# Patient Record
Sex: Female | Born: 1990 | Race: White | Hispanic: No | Marital: Single | Smoking: Former smoker
Health system: Southern US, Community
[De-identification: ages and names within clinical notes are randomized; demographics above are authoritative.]

## PROBLEM LIST (undated history)

## (undated) ENCOUNTER — Inpatient Hospital Stay (HOSPITAL_COMMUNITY): Payer: Self-pay

## (undated) DIAGNOSIS — O4290 Premature rupture of membranes, unspecified as to length of time between rupture and onset of labor, unspecified weeks of gestation: Secondary | ICD-10-CM

## (undated) DIAGNOSIS — A609 Anogenital herpesviral infection, unspecified: Secondary | ICD-10-CM

## (undated) DIAGNOSIS — Z8619 Personal history of other infectious and parasitic diseases: Secondary | ICD-10-CM

## (undated) HISTORY — DX: Personal history of other infectious and parasitic diseases: Z86.19

## (undated) HISTORY — DX: Anogenital herpesviral infection, unspecified: A60.9

## (undated) HISTORY — PX: NO PAST SURGERIES: SHX2092

---
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
## 2010-11-22 LAB — WOUND CULTURE

## 2011-06-15 ENCOUNTER — Emergency Department (HOSPITAL_COMMUNITY)
Admission: EM | Admit: 2011-06-15 | Discharge: 2011-06-15 | Disposition: A | Discharge status: Home / Self Care | Payer: Self-pay | Attending: Emergency Medicine | Admitting: Emergency Medicine

## 2011-06-15 ENCOUNTER — Encounter: Payer: Self-pay | Admitting: Adult Health

## 2011-06-15 DIAGNOSIS — H53149 Visual discomfort, unspecified: Secondary | ICD-10-CM | POA: Insufficient documentation

## 2011-06-15 DIAGNOSIS — G43909 Migraine, unspecified, not intractable, without status migrainosus: Secondary | ICD-10-CM | POA: Insufficient documentation

## 2011-06-15 DIAGNOSIS — R42 Dizziness and giddiness: Secondary | ICD-10-CM | POA: Insufficient documentation

## 2011-06-15 MED ORDER — DEXAMETHASONE SODIUM PHOSPHATE 10 MG/ML IJ SOLN
10.0000 mg | Freq: Once | INTRAMUSCULAR | Status: AC
  Administered 2011-06-15: 10 mg via INTRAVENOUS
  Filled 2011-06-15: qty 1

## 2011-06-15 MED ORDER — METOCLOPRAMIDE HCL 5 MG/ML IJ SOLN
10.0000 mg | Freq: Once | INTRAMUSCULAR | Status: AC
  Administered 2011-06-15: 10 mg via INTRAVENOUS
  Filled 2011-06-15: qty 2

## 2011-06-15 MED ORDER — SODIUM CHLORIDE 0.9 % IV BOLUS (SEPSIS)
1000.0000 mL | Freq: Once | INTRAVENOUS | Status: AC
  Administered 2011-06-15: 1000 mL via INTRAVENOUS

## 2011-06-15 MED ORDER — DIPHENHYDRAMINE HCL 50 MG/ML IJ SOLN
25.0000 mg | Freq: Once | INTRAMUSCULAR | Status: AC
  Administered 2011-06-15: 25 mg via INTRAVENOUS
  Filled 2011-06-15: qty 1

## 2011-06-15 NOTE — ED Notes (Signed)
Migraine that started 4 days ago associated with light and sound sensitivity and nausea. Taking IBuprofen without any relief

## 2011-06-15 NOTE — ED Provider Notes (Signed)
History     CSN: [id number] Arrival date & time: 06/15/2011  3:59 PM   First MD Initiated Contact with Patient 06/15/11 1759      Chief Complaint  Patient presents with  . Migraine    (Consider location/radiation/quality/duration/timing/severity/associated sxs/prior treatment) Patient is a 20 y.o. female presenting with migraine. The history is provided by the patient.  Migraine This is a chronic problem. The current episode started in the past 7 days. The problem occurs constantly. The problem has been unchanged. Associated symptoms include headaches. Pertinent negatives include no chest pain, chills, congestion, coughing, fatigue, fever, myalgias, neck pain, numbness, rash, sore throat or visual change. Associated symptoms comments: Positive nausea, photophobia. The symptoms are aggravated by bending. She has tried NSAIDs for the symptoms. The treatment provided mild relief.  Pain throbbing. Typical migraine for pt.  Past Medical History  Diagnosis Date  . Migraine     History reviewed. No pertinent past surgical history.  History reviewed. No pertinent family history.  History  Substance Use Topics  . Smoking status: Current Everyday Smoker    Types: Cigarettes  . Smokeless tobacco: Not on file  . Alcohol Use: No     Review of Systems  Constitutional: Negative for fever, chills and fatigue.  HENT: Negative for ear pain, congestion, sore throat, neck pain, neck stiffness and tinnitus.   Eyes: Positive for photophobia. Negative for pain and visual disturbance.  Respiratory: Negative for cough and shortness of breath.   Cardiovascular: Negative for chest pain and leg swelling.  Musculoskeletal: Negative for myalgias.  Skin: Negative for rash.  Neurological: Positive for dizziness and headaches. Negative for numbness.  All other systems reviewed and are negative.    Allergies  Review of patient's allergies indicates no known allergies.  Home Medications   Current  Outpatient Rx  Name Route Sig Dispense Refill  . IBUPROFEN 200 MG PO TABS Oral Take 200 mg by mouth every 6 (six) hours as needed. Pain.      BP 108/66  Pulse 57  Temp(Src) 97.8 F (36.6 C) (Oral)  Resp 20  SpO2 100%  Physical Exam  Constitutional: She is oriented to person, place, and time. She appears well-developed and well-nourished. She appears distressed.       Sitting in dark room with sunglasses on  HENT:  Head: Normocephalic and atraumatic.  Eyes: Conjunctivae and EOM are normal. Pupils are equal, round, and reactive to light.  Neck: Normal range of motion. Neck supple.  Cardiovascular: Normal rate and regular rhythm.  Exam reveals no gallop and no friction rub.   No murmur heard. Pulmonary/Chest: Effort normal and breath sounds normal.  Abdominal: Soft. She exhibits no distension. There is no tenderness.  Musculoskeletal: She exhibits no edema and no tenderness.  Neurological: She is alert and oriented to person, place, and time. She has normal strength. No cranial nerve deficit or sensory deficit. Coordination and gait normal.  Skin: Skin is warm and dry. No rash noted.  Psychiatric: She has a normal mood and affect. Her behavior is normal.    ED Course  Procedures (including critical care time)    1. Migraine       MDM  Typical migraine for pt. Pt pain significantly improved after fluids and medications- no analgesics necessary. Will d/c home.        Elwyn Reach [place], [place] 06/15/11 2011

## 2011-06-15 NOTE — ED Provider Notes (Signed)
Medical screening examination/treatment/procedure(s) were conducted as a shared visit with non-physician practitioner(s) and myself.  I personally evaluated the patient during the encounter   Nelia Shi, MD 06/15/11 2232

## 2011-08-23 ENCOUNTER — Inpatient Hospital Stay (HOSPITAL_COMMUNITY)
Admission: RE | Admit: 2011-08-23 | Discharge: 2011-08-30 | DRG: 897 | Disposition: A | Discharge status: Home / Self Care | Payer: No Typology Code available for payment source | Attending: Psychiatry | Admitting: Psychiatry

## 2011-08-23 ENCOUNTER — Encounter (HOSPITAL_COMMUNITY): Payer: Self-pay | Admitting: *Deleted

## 2011-08-23 ENCOUNTER — Emergency Department (HOSPITAL_COMMUNITY)
Admission: EM | Admit: 2011-08-23 | Discharge: 2011-08-23 | Disposition: A | Discharge status: Other Acute Inpatient Hospital | Payer: Self-pay | Attending: Emergency Medicine | Admitting: Emergency Medicine

## 2011-08-23 DIAGNOSIS — A749 Chlamydial infection, unspecified: Secondary | ICD-10-CM

## 2011-08-23 DIAGNOSIS — F1994 Other psychoactive substance use, unspecified with psychoactive substance-induced mood disorder: Secondary | ICD-10-CM

## 2011-08-23 DIAGNOSIS — F1123 Opioid dependence with withdrawal: Secondary | ICD-10-CM | POA: Diagnosis present

## 2011-08-23 DIAGNOSIS — F1220 Cannabis dependence, uncomplicated: Secondary | ICD-10-CM

## 2011-08-23 DIAGNOSIS — F1910 Other psychoactive substance abuse, uncomplicated: Secondary | ICD-10-CM | POA: Insufficient documentation

## 2011-08-23 DIAGNOSIS — F1120 Opioid dependence, uncomplicated: Principal | ICD-10-CM

## 2011-08-23 DIAGNOSIS — F1310 Sedative, hypnotic or anxiolytic abuse, uncomplicated: Secondary | ICD-10-CM

## 2011-08-23 DIAGNOSIS — F1193 Opioid use, unspecified with withdrawal: Secondary | ICD-10-CM | POA: Diagnosis present

## 2011-08-23 LAB — COMPREHENSIVE METABOLIC PANEL
ALT: 8 U/L (ref 0–35)
Albumin: 4.3 g/dL (ref 3.5–5.2)
Alkaline Phosphatase: 48 U/L (ref 39–117)
Glucose, Bld: 93 mg/dL (ref 70–99)
Potassium: 3.8 mEq/L (ref 3.5–5.1)
Sodium: 139 mEq/L (ref 135–145)
Total Protein: 7.8 g/dL (ref 6.0–8.3)

## 2011-08-23 LAB — POCT PREGNANCY, URINE: Preg Test, Ur: NEGATIVE

## 2011-08-23 LAB — CBC
Hemoglobin: 14.3 g/dL (ref 12.0–15.0)
MCHC: 34.8 g/dL (ref 30.0–36.0)
RDW: 11.7 % (ref 11.5–15.5)
WBC: 6.1 10*3/uL (ref 4.0–10.5)

## 2011-08-23 LAB — RAPID URINE DRUG SCREEN, HOSP PERFORMED
Amphetamines: NOT DETECTED
Barbiturates: NOT DETECTED
Benzodiazepines: NOT DETECTED

## 2011-08-23 MED ORDER — TRAZODONE HCL 50 MG PO TABS
50.0000 mg | ORAL_TABLET | Freq: Every evening | ORAL | Status: DC | PRN
  Administered 2011-08-23 – 2011-08-29 (×10): 50 mg via ORAL
  Filled 2011-08-23 (×17): qty 1

## 2011-08-23 MED ORDER — DICYCLOMINE HCL 20 MG PO TABS: 20.0000 mg | ORAL_TABLET | ORAL | Status: AC | PRN

## 2011-08-23 MED ORDER — NICOTINE 21 MG/24HR TD PT24
21.0000 mg | MEDICATED_PATCH | Freq: Every day | TRANSDERMAL | Status: DC
  Administered 2011-08-23 – 2011-08-29 (×7): 21 mg via TRANSDERMAL
  Filled 2011-08-23 (×8): qty 1

## 2011-08-23 MED ORDER — LOPERAMIDE HCL 2 MG PO CAPS: 2.0000 mg | ORAL_CAPSULE | ORAL | Status: AC | PRN

## 2011-08-23 MED ORDER — IBUPROFEN 200 MG PO TABS: 400.0000 mg | ORAL_TABLET | Freq: Three times a day (TID) | ORAL | Status: DC | PRN

## 2011-08-23 MED ORDER — ONDANSETRON 4 MG PO TBDP
4.0000 mg | ORAL_TABLET | Freq: Four times a day (QID) | ORAL | Status: AC | PRN
Start: 2011-08-23 — End: 2011-08-28
  Administered 2011-08-24 – 2011-08-26 (×2): 4 mg via ORAL
  Filled 2011-08-23: qty 1

## 2011-08-23 MED ORDER — CLONIDINE HCL 0.1 MG PO TABS
0.1000 mg | ORAL_TABLET | Freq: Four times a day (QID) | ORAL | Status: AC
  Administered 2011-08-23 – 2011-08-25 (×5): 0.1 mg via ORAL
  Filled 2011-08-23 (×10): qty 1

## 2011-08-23 MED ORDER — ACETAMINOPHEN 325 MG PO TABS
650.0000 mg | ORAL_TABLET | Freq: Four times a day (QID) | ORAL | Status: DC | PRN
  Administered 2011-08-25 – 2011-08-28 (×3): 650 mg via ORAL

## 2011-08-23 MED ORDER — IBUPROFEN 200 MG PO TABS: 600.0000 mg | ORAL_TABLET | Freq: Three times a day (TID) | ORAL | Status: DC | PRN

## 2011-08-23 MED ORDER — METHOCARBAMOL 500 MG PO TABS: 500.0000 mg | ORAL_TABLET | Freq: Three times a day (TID) | ORAL | Status: AC | PRN

## 2011-08-23 MED ORDER — ALUM & MAG HYDROXIDE-SIMETH 200-200-20 MG/5ML PO SUSP: 30.0000 mL | ORAL | Status: DC | PRN

## 2011-08-23 MED ORDER — LORAZEPAM 1 MG PO TABS: 1.0000 mg | ORAL_TABLET | Freq: Three times a day (TID) | ORAL | Status: DC | PRN

## 2011-08-23 MED ORDER — CLONIDINE HCL 0.1 MG PO TABS
0.1000 mg | ORAL_TABLET | ORAL | Status: AC
  Administered 2011-08-26 – 2011-08-27 (×2): 0.1 mg via ORAL
  Filled 2011-08-23 (×4): qty 1

## 2011-08-23 MED ORDER — HYDROXYZINE HCL 25 MG PO TABS
25.0000 mg | ORAL_TABLET | Freq: Four times a day (QID) | ORAL | Status: AC | PRN
  Administered 2011-08-23 – 2011-08-27 (×5): 25 mg via ORAL
  Filled 2011-08-23: qty 1

## 2011-08-23 MED ORDER — NAPROXEN 500 MG PO TABS: 500.0000 mg | ORAL_TABLET | Freq: Two times a day (BID) | ORAL | Status: AC | PRN

## 2011-08-23 MED ORDER — ZOLPIDEM TARTRATE 5 MG PO TABS: 5.0000 mg | ORAL_TABLET | Freq: Every evening | ORAL | Status: DC | PRN

## 2011-08-23 MED ORDER — ONDANSETRON HCL 4 MG PO TABS: 4.0000 mg | ORAL_TABLET | Freq: Three times a day (TID) | ORAL | Status: DC | PRN

## 2011-08-23 MED ORDER — CLONIDINE HCL 0.1 MG PO TABS
0.1000 mg | ORAL_TABLET | Freq: Every day | ORAL | Status: DC
  Administered 2011-08-30: 0.1 mg via ORAL
  Filled 2011-08-23 (×2): qty 1

## 2011-08-23 MED ORDER — MAGNESIUM HYDROXIDE 400 MG/5ML PO SUSP: 30.0000 mL | Freq: Every day | ORAL | Status: DC | PRN

## 2011-08-23 NOTE — ED Notes (Signed)
Pt now decided to stay b/c she called her mother to pick her up and her mother said she can stay or be on the street.  Pt is agrees to stay.   Eric from [facility] has called back.  He says she will go to bed 307-2 and that when pt gets there and decides she wants to leave, she will be under IVC.  Pt will be explained this information.

## 2011-08-23 NOTE — ED Provider Notes (Signed)
Medical screening examination/treatment/procedure(s) were performed by non-physician practitioner and as supervising physician I was immediately available for consultation/collaboration.  Natasha Paulson R Francis Doenges, MD 08/23/11 1956 

## 2011-08-23 NOTE — ED Notes (Signed)
Pt. Had blood drawn. Pt. Belongings are placed in personal bag. Pt. Belongings are black pants, shirt, white/gray jacket, pink and white tennis shoes, black samsung cell phone, socks

## 2011-08-23 NOTE — BH Assessment (Addendum)
Pt assessed and [facility] at sent to [facility] pending medical clearance.   Later received call from [facility] nurse at [facility]. Sts that patient is now medically cleared. Writer contacted assessment department-spoke to Shay and made her aware that pt was cleared.   Writer contacted EDP (Dr. Preston Fleeting) and made him aware of patients disposition. He was informed that pt was here pending clearance and accepted at [facility]. Pt placed up for discharge. Writer initiated support paperwork (obtained MR# with Shelly Coss, gathered voluntary and consent for patient to sign, etc.) Entered the room and explained to patient that she would be transported after signing support papers. Pt sts, "I changed my mind..I don't want to go anymore ..my parents are here to pick me up". Writer consulted with nurse and PA-Sherri. We both decided that pt would be discharged home with referral information.   Pt given referral information for follow up. Pt has referrals to CD-IOP programs, support groups, various out-pt providers, etc.   Susa Raring in the assessment office and made her aware that patient changed her mind and out-pt referrals were given.

## 2011-08-23 NOTE — ED Notes (Addendum)
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 Last used yesterday. Pt reports n/v, diaphoresis, chills, shaking, HA. Has been to [facility] and has been accepted with a bed pending per Consulting civil engineer. Process of medical clearance explained, pt verbalizes understanding and agreement. Denies SI/HI.

## 2011-08-23 NOTE — ED Notes (Signed)
Report to [facility] at [facility].   She states pt is ok to head over now.

## 2011-08-23 NOTE — Progress Notes (Signed)
Patient ID: Deborah Russell, female   DOB: 03-27-1991, 22 y.o.   MRN: [id number] Pt admitted voluntarily to [facility] for detox. Pt presented w/mother. Pt acknowledges using marijuana since the age of 42 and heroine for 2 years.  Pt states that for a while she was also using pain pills but denies current use. Pt states that she is here because her family found out what she was doing and she didn't want to disappoint them. Pt asked if she would have made the decision to detox if her family hadn't found out and she replied she was on the verge of making that decision because she has nephews and she didn't want to miss them growing up. Pt denies SI, HI and AVH. Initial COW assessment 9. Pt pleasant and cooperative during admission process. Fifteen minute checks initiated. Pt oriented to unit.

## 2011-08-23 NOTE — BH Assessment (Signed)
Assessment Note   Deborah Russell is an 21 y.o. female. PT PRESENTS WITH DEPRESSION STATING THAT FAMILY FOUND OUT THAT SHE HAD BEEN ABUSING HEROINE; 5 BAGS PER DAY. PT DENIES CURRENT IDEATION OR ANY HX OF TX. PT SAYS  HER APPETITE HAS DECREASE AS WELL AS SLEEP IN  DAYS. PT IS ABLE TO CONTRACT FOR SAFETY & WAS REFERRED TO [facility] FOR MEDICAL CLEARANCE.  Axis I: Substance Induced Mood Disorder and ALCOHOL DEPENDENCE Axis II: Deferred Axis III:  Past Medical History  Diagnosis Date  . Migraine    Axis IV: problems with primary support group Axis V: 31-40 impairment in reality testing  Past Medical History:  Past Medical History  Diagnosis Date  . Migraine     History reviewed. No pertinent past surgical history.  Family History: No family history on file.  Social History:  reports that she has been smoking Cigarettes.  She does not have any smokeless tobacco history on file. She reports that she drinks alcohol. She reports that she uses illicit drugs (Marijuana).  Additional Social History:    Allergies: No Known Allergies  Home Medications:  No current facility-administered medications on file as of 08/23/2011.   Medications Prior to Admission  Medication Sig Dispense Refill  . ibuprofen (ADVIL,MOTRIN) 200 MG tablet Take 200 mg by mouth every 6 (six) hours as needed. Pain.        OB/GYN Status:  Patient's last menstrual period was 08/04/2011.  General Assessment Data Location of Assessment: [facility] Assessment Services Living Arrangements: Relatives;Parent Can pt return to current living arrangement?: No Admission Status: Voluntary Is patient capable of signing voluntary admission?: Yes Transfer from: Home Referral Source: Self/Family/Friend     Risk to self Suicidal Ideation: No Suicidal Intent: No Is patient at risk for suicide?: No Suicidal Plan?: No Access to Means: No What has been your use of drugs/alcohol within the last 12 months?: HEROINE STARTED AT AGE 35; USES  5 BAGS($50) & LAST USE WAS 1/14//13 Previous Attempts/Gestures: No How many times?: 0  Other Self Harm Risks: NA Triggers for Past Attempts: Unpredictable Intentional Self Injurious Behavior: None Family Suicide History: No Recent stressful life event(s): Turmoil (Comment) Persecutory voices/beliefs?: No Depression: No Depression Symptoms: Loss of interest in usual pleasures;Guilt;Feeling worthless/self pity Substance abuse history and/or treatment for substance abuse?: No Suicide prevention information given to non-admitted patients: Not applicable  Risk to Others Homicidal Ideation: No Thoughts of Harm to Others: No Current Homicidal Intent: No Current Homicidal Plan: No Access to Homicidal Means: No Identified Victim: NA History of harm to others?: No Assessment of Violence: None Noted Violent Behavior Description: CALM, COOPERATIVE Does patient have access to weapons?: No Criminal Charges Pending?: No Does patient have a court date: No  Psychosis Hallucinations: None noted Delusions: None noted  Mental Status Report Appear/Hygiene: Improved Eye Contact: Good Motor Activity: Freedom of movement Speech: Logical/coherent;Soft Level of Consciousness: Alert Mood: Depressed;Sad Affect: Appropriate to circumstance;Sad Anxiety Level: None Thought Processes: Coherent;Relevant Judgement: Unimpaired Orientation: Person;Place;Time;Situation Obsessive Compulsive Thoughts/Behaviors: None  Cognitive Functioning Concentration: Decreased Memory: Recent Intact;Remote Intact IQ: Average Insight: Fair Impulse Control: Fair Appetite: Poor Weight Loss: 0  Weight Gain: 0  Sleep: Decreased Total Hours of Sleep: 0  Vegetative Symptoms: None  Prior Inpatient Therapy Prior Inpatient Therapy: No Prior Therapy Dates: NA Prior Therapy Facilty/Provider(s): NA Reason for Treatment: NA  Prior Outpatient Therapy Prior Outpatient Therapy: No Prior Therapy Dates: NA Prior Therapy  Facilty/Provider(s): NA Reason for Treatment: NA  Additional Information 1:1 In Past 12 Months?: No CIRT Risk: No Elopement Risk: No Does patient have medical clearance?: Yes     Disposition:  Disposition Disposition of Patient: Inpatient treatment program;Referred to ([facility]- ACCEPTED BY DR. Allena Katz) Type of inpatient treatment program: Adult  On Site Evaluation by:   Reviewed with Physician:     Waldron Session 08/23/2011 1:14 PM

## 2011-08-23 NOTE — ED Notes (Signed)
Been on hold x 9 minutes to give report. Unable to hold anymore. Will try and call back again.

## 2011-08-23 NOTE — ED Notes (Signed)
Attempting to give report to [facility].  Pt just walked to the bathroom w/her mother.  NAD noted.  Pt states understanding that she will be under IVC if she tries to leave again.

## 2011-08-23 NOTE — ED Notes (Signed)
Pt wanded and belongings searched by security. 

## 2011-08-23 NOTE — Tx Team (Signed)
Initial Interdisciplinary Treatment Plan  PATIENT STRENGTHS: (choose at least two) Ability for insight Average or above average intelligence Communication skills General fund of knowledge Motivation for treatment/growth Physical Health Supportive family/friends  PATIENT STRESSORS: Financial difficulties Occupational concerns Substance abuse   PROBLEM LIST: Problem List/Patient Goals Date to be addressed Date deferred Reason deferred Estimated date of resolution  detox 08/23/2011     anxiety 08/23/2011     depression 08/23/2011                                          DISCHARGE CRITERIA:  Ability to meet basic life and health needs Adequate post-discharge living arrangements Improved stabilization in mood, thinking, and/or behavior Motivation to continue treatment in a less acute level of care Need for constant or close observation no longer present Verbal commitment to aftercare and medication compliance Withdrawal symptoms are absent or subacute and managed without 24-hour nursing intervention  PRELIMINARY DISCHARGE PLAN: Outpatient therapy Participate in family therapy Return to previous living arrangement  PATIENT/FAMIILY INVOLVEMENT: This treatment plan has been presented to and reviewed with the patient, Deborah Russell, and/or family member,.  The patient and family have been given the opportunity to ask questions and make suggestions.  Deborah Russell 08/23/2011, 7:39 PM

## 2011-08-23 NOTE — ED Notes (Signed)
[facility] called.  Pt will be going to Rm 307 bed 2 but RN unable to take report as it is shift change.  Undersigned RN asked to call back in .

## 2011-08-23 NOTE — ED Notes (Signed)
Patient is resting comfortably.  NAD noted.  Has just used the telephone.

## 2011-08-23 NOTE — ED Provider Notes (Signed)
History     CSN: [id number]  Arrival date & time 08/23/11  1250   First MD Initiated Contact with Patient 08/23/11 1325      Chief Complaint  Patient presents with  . Medical Clearance    (Consider location/radiation/quality/duration/timing/severity/associated sxs/prior treatment) Patient is a 21 y.o. female presenting with drug/alcohol assessment. The history is provided by the patient.  Drug / Alcohol Assessment  Pt here asking for detox from drug use. Pt states she smokes pot and uses heroin. States uses it daily. Never been in detox in the past. Denies depression, anxiety, any other drugs or alcohol. No medical problems. Does not take any medications. Denies SI or HI.  Past Medical History  Diagnosis Date  . Migraine     History reviewed. No pertinent past surgical history.  No family history on file.  History  Substance Use Topics  . Smoking status: Current Everyday Smoker    Types: Cigarettes  . Smokeless tobacco: Not on file  . Alcohol Use: Yes     occasionally    OB History    Grav Para Term Preterm Abortions TAB SAB Ect Mult Living                  Review of Systems  Constitutional: Negative.   HENT: Negative.   Eyes: Negative.   Respiratory: Negative.   Cardiovascular: Negative.   Gastrointestinal: Negative.   Genitourinary: Negative.   Musculoskeletal: Negative.   Neurological: Negative.   Psychiatric/Behavioral: Negative for suicidal ideas and behavioral problems. The patient is not nervous/anxious.     Allergies  Review of patient's allergies indicates no known allergies.  Home Medications   Current Outpatient Rx  Name Route Sig Dispense Refill  . IBUPROFEN 200 MG PO TABS Oral Take 200 mg by mouth every 6 (six) hours as needed. Pain.      BP 111/69  Pulse 67  Temp(Src) 98.1 F (36.7 C) (Oral)  Resp 18  SpO2 100%  LMP 08/04/2011  Physical Exam  Nursing note and vitals reviewed. Constitutional: She is oriented to person, place,  and time. She appears well-developed and well-nourished. No distress.  HENT:  Head: Normocephalic and atraumatic.  Eyes: Conjunctivae are normal.  Neck: Neck supple.  Cardiovascular: Normal rate, regular rhythm and normal heart sounds.   No murmur heard. Pulmonary/Chest: Effort normal and breath sounds normal. No respiratory distress.  Abdominal: Soft. Bowel sounds are normal. There is no tenderness.  Musculoskeletal: Normal range of motion.  Neurological: She is alert and oriented to person, place, and time.  Skin: Skin is warm and dry.  Psychiatric: She has a normal mood and affect.    ED Course  Procedures (including critical care time)  1:50 PM Pt seen and examined by me. Pt requesting heroin detox. Pt in NAD. Denies SI or hI. Pleasant, cooperative. Spoke with [facility], will come and assess.  No diagnosis found.  4:06 PM Pt apparently got a bed, but she now wants to leave, does not want to stay. Wants to do outpatient detox. Will see if [facility] can come and provide outpatient resources. Pt denies SI or HI.   MDM          Lottie Mussel, PA 08/23/11 1600  Lottie Mussel, [place] 08/23/11 1607

## 2011-08-23 NOTE — ED Notes (Signed)
Friend visiting with pt. Visitor wanded by security before seeing pt.

## 2011-08-24 DIAGNOSIS — F1120 Opioid dependence, uncomplicated: Principal | ICD-10-CM

## 2011-08-24 NOTE — Progress Notes (Addendum)
Pt is out in milieu attending groups and interacting with peers.  C/O nausea and anxiety r/t opiate withdrawal. PRN medications requested and received. Instructed on course of detox.  Support and encouragement offered. 15' checks cont for safety. Cont current POC.  1500- Urine obtained for labs. Genice Rouge RN
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
## 2011-08-24 NOTE — [facility] Suicide Risk Assessment (Signed)
Suicide Risk Assessment  Admission Assessment     Demographic factors:  See chart.  Current Mental Status:  Patient seen and evaluated. Chart reviewed. Patient stated that her mood was "good". Her affect was mood congruent and euthymic. She denied any current thoughts of self injurious behavior, suicidal ideation or homicidal ideation. There were no auditory or visual hallucinations, paranoia, delusional thought processes, or mania noted.  Thought process was linear and goal directed.  No psychomotor agitation or retardation was noted. Speech was normal rate, tone and volume. Eye contact was good. Judgment and insight are limited.  Patient has been up and engaged on the unit.  No acute safety concerns reported from team.  Loss Factors: relationship with BF    Historical Factors: No hx reported SI/HI/Self injurious behavior/attempts/plans.  Risk Reduction Factors:  Risk Reduction Factors: Employed;Living with another person, especially a relative;Positive social support; lives with mother  CLINICAL FACTORS: Opioid Dependence and Withdrawal; Cannabis Dependence; r/o Benzo Abuse; r/o SIMD; Migraines  COGNITIVE FEATURES THAT CONTRIBUTE TO RISK: limited insight.  SUICIDE RISK: Patient is currently viewed as a low risk of harm to herself and others in light of her history and risk factors. There are no acute safety concerns and she is stable for admission. Her continued sobriety, potential medication management and followup will mitigate against any potential risk in the future.   VS: Filed Vitals:   08/24/11 0801  BP: 88/56  Pulse: 134  Temp:   Resp:     Repeat vitals at 1145am: 101/69; 64 - 94/63; 96; 97.5 Temp; 20 R  Meds:   . cloNIDine  0.1 mg Oral QID   Followed by  . cloNIDine  0.1 mg Oral BH-qamhs   Followed by  . cloNIDine  0.1 mg Oral QAC breakfast  . nicotine  21 mg Transdermal Daily  . traZODone  50 mg Oral QHS,MR X 1    PLAN OF CARE: Pt admitted for crisis  stabilization and treatment.  Please see orders.   Medications reviewed with pt and medication education provided.  Pt seen and evaluated by physician extender as well, please see H&P.  Will continue q15 minute checks per unit protocol.  No clinical indication for one on one level of observation at this time.  Pt contracting for safety.  Mental health treatment, medication management and continued sobriety will mitigate against the increased risk of harm to self and/or others.  Discussed the importance of recovery with pt, as well as, tools to move forward in a healthy & safe manner.  Pt agreeable with the plan.  Discussed with the team. Pt interested in rehab.  Will follow vitals and adjust meds as indicated.  Pt encouraged to push fluids and was given gatorade as well.
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
## 2011-08-24 NOTE — Progress Notes (Signed)
This newly admitted pt has been started on the clonidine protocol with her first dose given tonight. Pt presented with nicotine cravings upon arrival on the unit. Pt was given a nicotine patch and informed of removing at bedtime in order to prevent nightmares. Pt has been adjusting to the unit appropriately. Continued support and availability has been extended to this pt. Pt safety remains with this patient.

## 2011-08-24 NOTE — Progress Notes (Signed)
Pt has been in the dayroom interacting with other patients.  She is on Clonidine Protocol for opiate addiction.  She denies any withdrawal symptoms at this time.  She denies SI/HI.  She has labs pending for STDs.  She voices no needs/concerns at this time.  Safety maintained with q15 minute checks.
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
## 2011-08-25 LAB — GC/CHLAMYDIA PROBE AMP, URINE: GC Probe Amp, Urine: NEGATIVE

## 2011-08-25 LAB — RPR: RPR Ser Ql: NONREACTIVE

## 2011-08-25 MED ORDER — MENTHOL 3 MG MT LOZG
1.0000 | LOZENGE | OROMUCOSAL | Status: DC | PRN
  Administered 2011-08-25: 3 mg via ORAL

## 2011-08-25 NOTE — Progress Notes (Signed)
Patient ID: Deborah Russell, female   DOB: 1991-03-29, 21 y.o.   MRN: [id number] Patient has been attending groups and going to dining room for meals.   Has been cooperative and pleasant this afternoon.

## 2011-08-25 NOTE — Tx Team (Signed)
Interdisciplinary Treatment Plan Update (Adult)  Date:  08/25/2011  Time Reviewed:  9:58 AM   Progress in Treatment: Attending groups: Yes Participating in groups:  Yes Taking medication as prescribed: Yes Tolerating medication:  Yes Family/Significant othe contact made:  Counselor assessing for appropriate contact Patient understands diagnosis:  Yes Discussing patient identified problems/goals with staff:  Yes Medical problems stabilized or resolved:  Yes Denies suicidal/homicidal ideation: Yes Issues/concerns per patient self-inventory:  None identified Other: N/A  New problem(s) identified: None Identified  Reason for Continuation of Hospitalization: Medication stabilization Withdrawal symptoms  Interventions implemented related to continuation of hospitalization: mood stabilization, medication monitoring and adjustment, group therapy and psycho education, safety checks q 15 mins  Additional comments: N/A  Estimated length of stay: 5 days  Discharge Plan: Pt requesting long term treatment - SW assessing for appropriate referrals  New goal(s): N/A  Review of initial/current patient goals per problem list:    1.  Goal(s): Address substance use  Met:  No  Target date: by discharge  As evidenced by: completing detox protocol and refer to appropriate treatment  2.  Goal (s): Reduce depressive symptoms  Met:  No  Target date: by discharge  As evidenced by: Reducing depression from a 10 to a 3 as reported by pt.  Pt denies depression.   3.  Goal(s): Reduce anxiety symptoms  Met:  No  Target date: by discharge  As evidenced by: Reducing anxiety from a 10 to a 3 as reported by pt.  Pt denies anxiety.    Attendees: Patient:  Deborah Russell 08/25/2011 10:00 AM   Family:     Physician:  Lupe Carney, DO 08/25/2011 9:58 AM   Nursing: Quintella Reichert, RN 08/25/2011 9:58 AM   Case Manager:  Reyes Ivan, LCSWA 08/25/2011  9:58 AM   Counselor:  Ronda Fairly, LCSWA 08/25/2011  9:58 AM   Other:  Richelle Ito, LCSW 08/25/2011 9:58 AM   Other:  Carolynn Comment, RN 08/25/2011 10:05 AM   Other:  Verne Spurr, PA 08/25/2011 10:05 AM   Other:  Tanya Nones, SW intern 08/25/2011 10:05 AM    Scribe for Treatment Team:   Reyes Ivan 08/25/2011 9:58 AM

## 2011-08-25 NOTE — Progress Notes (Signed)
Pt attended discharge planning group and actively participated.  Pt presents with calm mood and affect.  Pt states she does not have depression, anxiety and SI.  Pt was open with sharing reason for entering the hospital.  Pt states that she has been using heroin for 2 years and her family recently found out and want her to get help.  Pt states she feels guilty for using because her niece and nephew look up to her.  Pt states she wants help as well, requesting long term treatment.  Pt states it was both her and her family's idea to come here.  Pt states she resides with her mom in [place] and has transportation.  SW will make a referral to [facility] per pt's request for treatment.  No further needs at this time.  Safety planning and suicide prevention discussed.     Reyes Ivan, LCSWA 08/25/2011  9:41 AM
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
## 2011-08-26 DIAGNOSIS — A749 Chlamydial infection, unspecified: Secondary | ICD-10-CM | POA: Diagnosis present

## 2011-08-26 LAB — HEPATITIS PANEL, ACUTE
HCV Ab: NEGATIVE
Hepatitis B Surface Ag: NEGATIVE

## 2011-08-26 MED ORDER — AZITHROMYCIN 500 MG PO TABS
1000.0000 mg | ORAL_TABLET | Freq: Once | ORAL | Status: AC
  Administered 2011-08-26: 1000 mg via ORAL
  Filled 2011-08-26: qty 4
  Filled 2011-08-26: qty 2

## 2011-08-26 NOTE — Progress Notes (Signed)
Pt is out in milieu interacting with peers and attending groups.  Rates depression and hopelessness at 1 but affect does not match stated mood. Continues to c/o cravings r/t withdrawal. States she wants "to make better choices and stay sober" at d/c. Denies SI and no c/o pain. Held clonidine due to 99/64 BP and offered alternative medications for w/d symptoms and coping strategies.  12 step concepts reenforced and 15' checks cont for safety.

## 2011-08-26 NOTE — Progress Notes (Signed)
[facility] Group Notes:  (Counselor/Nursing/MHT/Case Management/Adjunct)  08/26/2011 1:08 PM  Type of Therapy:  Group Therapy  Participation Level:  Active  Participation Quality:  Appropriate and Attentive  Affect:  Appropriate  Cognitive:  Oriented  Insight:  Good  Engagement in Group:  Good  Engagement in Therapy:  Good  Modes of Intervention:  Problem-solving, Support and exploration  Summary of Progress/Problems: Pt was active in group therapy session and related to "I am your addiction" stating for her it had been all about getting instant gratificiation from drug use stating she never really thought about the consequences but she does now. Pt states her addiction has taken so much- her car, house, education and motivation. Pt open to feedback. Deborah Russell, LPCA    Deborah Russell 08/26/2011, 1:08 PM

## 2011-08-26 NOTE — Tx Team (Signed)
Interdisciplinary Treatment Plan Update (Adult)  Date:  08/26/2011  Time Reviewed:  12:15 PM   Progress in Treatment: Attending groups: Yes Participating in groups:  Yes Taking medication as prescribed: Yes Tolerating medication:  Yes Family/Significant othe contact made:  Counselor assessing for appropriate contact Patient understands diagnosis:  Yes Discussing patient identified problems/goals with staff:  Yes Medical problems stabilized or resolved:  Yes Denies suicidal/homicidal ideation: Yes Issues/concerns per patient self-inventory:  None identified Other: N/A  New problem(s) identified: None Identified  Reason for Continuation of Hospitalization: Anxiety Medication stabilization Withdrawal symptoms  Interventions implemented related to continuation of hospitalization: mood stabilization, medication monitoring and adjustment, group therapy and psycho education, safety checks q 15 mins  Additional comments: N/A  Estimated length of stay: 2-3 days  Discharge Plan: Pt requesting long term treatment - SW to refer pt to [facility]  New goal(s): N/A  Review of initial/current patient goals per problem list:    1. Goal(s): Address substance use  Met: No  Target date: by discharge  As evidenced by: completing detox protocol and refer to appropriate treatment  2. Goal (s): Reduce depressive symptoms  Met: Yes Target date: by discharge  As evidenced by: Reducing depression from a 10 to a 3 as reported by pt. Pt denies depression.   3. Goal(s): Reduce anxiety symptoms  Met: Yes Target date: by discharge  As evidenced by: Reducing anxiety from a 10 to a 3 as reported by pt. Pt denies anxiety.     Attendees: Patient:  Deborah Russell 08/26/2011 11:30 am  Family:     Physician:  Lupe Carney, DO 08/26/2011 11:30 am  Nursing:    Case Manager:  Reyes Ivan, LCSWA 08/26/2011  11:30 am   Counselor:        Other:  Richelle Ito, LCSW 08/26/2011 11:30 am   Other:  Verne Spurr, PA 08/26/2011 11:30 am  Other:     Other:      Scribe for Treatment Team:   Reyes Ivan 08/26/2011 12:15 PM

## 2011-08-26 NOTE — Progress Notes (Signed)
Informed patient that d/c would be pushed to Monday 08/29/11 due to no bed availability at [facility].

## 2011-08-26 NOTE — Progress Notes (Signed)
Pt attended discharge planning group and actively participated.  Pt presents with calm mood and affect.  Pt denies having any withdrawal symptoms at this time.  Pt states she feels great and is ready to go to treatment.  Pt denies depression and SI and reports anxiety at a 3-4 today.  SW will see if pt can go to [facility] today.  If not, pt would have to wait until Monday for SW to contact further treatment options.  No further needs at this time.  Safety planning and suicide prevention discussed.     Reyes Ivan, LCSWA 08/26/2011  9:17 AM

## 2011-08-26 NOTE — Progress Notes (Signed)
[facility] MD Progress Note  08/26/2011 3:53 PM  S/O: Patient seen and evaluated in team. Chart reviewed. Patient stated that her mood was "good". Her affect was mood congruent and euthymic. She denied any current thoughts of self injurious behavior, suicidal ideation or homicidal ideation. There were no auditory or visual hallucinations, paranoia, delusional thought processes, or mania noted. Thought process was linear and goal directed. No psychomotor agitation or retardation was noted. Speech was normal rate, tone and volume. Eye contact was good. Judgment and insight are limited. Patient has been up and engaged on the unit. No acute safety concerns reported from team.    Sleep:  Number of Hours: 5.75   Vital Signs:Blood pressure 99/54, pulse 73, temperature 97.1 F (36.2 C), temperature source Oral, resp. rate 20, last menstrual period 08/04/2011.  Lab Results:  Results for orders placed during the hospital encounter of 08/23/11 (from the past 48 hour(s))  GC/CHLAMYDIA PROBE AMP, URINE     Status: Abnormal   Collection Time   08/24/11  7:00 PM      Component Value Range Comment   GC Probe Amp, Urine NEGATIVE  NEGATIVE     Chlamydia, Swab/Urine, PCR POSITIVE (*) NEGATIVE    HIV ANTIBODY (ROUTINE TESTING)     Status: Normal   Collection Time   08/24/11  7:40 PM      Component Value Range Comment   HIV NON REACTIVE  NON REACTIVE    RPR     Status: Normal   Collection Time   08/24/11  7:40 PM      Component Value Range Comment   RPR NON REACTIVE  NON REACTIVE    HEPATITIS PANEL, ACUTE     Status: Normal   Collection Time   08/24/11  7:40 PM      Component Value Range Comment   Hepatitis B Surface Ag NEGATIVE  NEGATIVE     HCV Ab NEGATIVE  NEGATIVE     Hep A IgM NEGATIVE  NEGATIVE     Hep B C IgM NEGATIVE  NEGATIVE      Physical Findings: CIWA:  CIWA-Ar Total: 1  COWS:  COWS Total Score: 6   A/P: Opioid Dependence and Withdrawal; Cannabis Dependence; r/o Benzo Abuse; r/o SIMD;  Migraines; Chlamydia, PCR Pos.   Continue current meds and TP.  Pt encouraged to continue pushing fluids.  Rehab pending discharge, most likely Monday. Exploring options with team.  Reviewed labs with pt, see orders.   Lupe Carney 08/26/2011, 3:53 PM

## 2011-08-26 NOTE — Progress Notes (Signed)
[facility] Group Notes:  (Counselor/Nursing/MHT/Case Management/Adjunct)  08/26/2011 3:23 PM  Type of Therapy:  Group Therapy  Participation Level:  Active  Participation Quality:  Appropriate and Sharing  Affect:  Appropriate  Cognitive:  Appropriate  Insight:  Good  Engagement in Group:  Good  Engagement in Therapy:  Good  Modes of Intervention:  Problem-solving, Support and exploration  Summary of Progress/Problems: Group explored life after recovery and how to fill days in a healthy ways- pt shared that most of her days have been spent finding ways to get drugs, places to use drugs and then hiding that she uses drugs. Pt shared how over the course of her addiction she has sold all of her stuff for drugs. Pt shared she would like to devote that time  Going back to school and attending NA meetings. Deborah Russell, LPCA   Deborah Russell 08/26/2011, 3:23 PM

## 2011-08-26 NOTE — Progress Notes (Addendum)
Patient ID: Deborah Russell, female   DOB: 27-Aug-1990, 21 y.o.   MRN: [id number] Attended karaoke this evening, was bright and pleasant on return, stated she had gotten up with two other girls and they sang a song.  Stated she had enjoyed the evening, was smiling.  Voiced no specific c/o's.  Stated she wants to go back to school but wants to go to long term program first.  Had majored in early childhood development. Seemed positive, admits to using poor judgment, wants to get life back on track, denies SI.  Will continue to monitor.

## 2011-08-26 NOTE — Progress Notes (Signed)
Patient ID: Deborah Russell, female   DOB: Nov 20, 1990, 21 y.o.   MRN: [id number] Pt. Notified that she has tested positive for Chlamydia and that medication is ordered for her treatment. She is also notified that her Acute hepatitis panel is negative as is her HIV test.  She had no questions for me regarding this. Azithromycin 1g x 1 dose is ordered per CDC protocol. Rona Ravens. Hampton Wixom PAC

## 2011-08-27 NOTE — Progress Notes (Signed)
Pt is quiet and withdrawn this shift but is attending groups and appropriately interacting.  Rates depression and hopelessness at 1. Denies SI. Goals are to "make better choices,stay sober, get a sponsor and attend N/A. "  Reports agitation r/t withdrawal.  Ongoing support and encouragement offered. Continue POC and continue 15' checks for safety.

## 2011-08-27 NOTE — Progress Notes (Signed)
Pt has been observed interacting appropriately on the unit. Pt attends group and has been calm and cooperative this evening. Pt was given an effective prn dose of Zofran for nausea this evening. Pt vitals were in acceptable range for her dose of clonidine this evening. Pt denies any thoughts of SI/HI at this time. Pt safety remains with q3min checks.Support and availability as needed has been extended to this patient.

## 2011-08-27 NOTE — Progress Notes (Signed)
Spoke to [place] at [facility] and she stated that the pt was not able to be accepted due to there not being any [facility] beds open at this time. Pt estimated D/C to [facility] has been canceled.

## 2011-08-27 NOTE — Progress Notes (Signed)
Patient ID: Yarelin Reichardt, female   DOB: 1991-05-11, 21 y.o.   MRN: [id number]   [facility] Group Notes:  (Counselor/Nursing/MHT/Case Management/Adjunct)  08/27/2011 1:15 PM  Type of Therapy:  Group Therapy, Dance/Movement Therapy   Participation Level:  Minimal  Participation Quality:  Redirectable  Affect:  Appropriate  Cognitive:  Oriented  Insight:  Limited  Engagement in Group:  Limited  Engagement in Therapy:  Limited  Modes of Intervention:  Clarification, Problem-solving, Role-play, Socialization and Support  Summary of Progress/Problems:   Pt shared that she feels like a chimpanzee when she is angry because she can be calm, but can change in a moment. Group conversed about how to deal with negative self-talk and supports who are negative by instead using health, positive coping skills. Pt did not share in the group, but she practiced attentive listening skills by making eye contact. Pts practiced a full body, breathing technique that encourages them to inhale positive energy and to exhale negative energy. Pts agreed to use this technique during quiet time today.  Thomasena Edis, [employer]

## 2011-08-27 NOTE — Progress Notes (Signed)
  Deborah Russell is a 21 y.o. female [id number] [birth year]  08/23/2011 Principal Problem:  *Opiate withdrawal Active Problems:  Chlamydia   Mental Status: alert & oriented mood is allright. Only took clonidine once yesterday at HS no longer in active withdrawal.  Subjective/Objective: Disappointed that she was not discharged to [facility] but understood when the counselor explained about the bed situation.Feels poorly as she is starting a cold.       Filed Vitals:   08/27/11 1159  BP: 128/88  Pulse: 96  Temp:   Resp:     Lab Results:   BMET    Component Value Date/Time   NA 139 08/23/2011 1336   K 3.8 08/23/2011 1336   CL 104 08/23/2011 1336   CO2 25 08/23/2011 1336   GLUCOSE 93 08/23/2011 1336   BUN 11 08/23/2011 1336   CREATININE 0.66 08/23/2011 1336   CALCIUM 9.6 08/23/2011 1336   GFRNONAA >90 08/23/2011 1336   GFRAA >90 08/23/2011 1336    Medications:  Scheduled:     . azithromycin  1,000 mg Oral Once  . cloNIDine  0.1 mg Oral BH-qamhs   Followed by  . cloNIDine  0.1 mg Oral QAC breakfast  . nicotine  21 mg Transdermal Daily  . traZODone  50 mg Oral QHS,MR X 1     PRN Meds acetaminophen, alum & mag hydroxide-simeth, dicyclomine, hydrOXYzine, ibuprofen, loperamide, magnesium hydroxide, menthol-cetylpyridinium, methocarbamol, naproxen, ondansetron  Plan: Continue current plan of care.  Deborah Russell,MICKIE D. 08/27/2011

## 2011-08-28 NOTE — Progress Notes (Signed)
Pt much brighter this shift and attending groups.  Expained [facility] placement disposition.  Rates depression and hopelessness at 1. Denies SI/HI. Grasping early recovery concepts.  Reports good appetite,sleep,energy level and ability to pay attention.  Denies physical problems. Ongoing support and encouragement. Cont POC and cont 15' checks for safety.

## 2011-08-28 NOTE — Progress Notes (Signed)
Patient ID: Deborah Russell, female   DOB: Jul 05, 1991, 21 y.o.   MRN: [id number] Has been up and about on the hall, attended group tonight , in spite of c/o's of cold sx and congestion.  Denies having w/d sx, but seems sad and rather quiet tonight.  Denies SI/HI.  Will continue to monitor.

## 2011-08-28 NOTE — Progress Notes (Signed)
  Deborah Russell is a 21 y.o. female [id number] 10-22-1990  08/23/2011 Principal Problem:  *Opiate withdrawal Active Problems:  Chlamydia   Mental Status: Alert and oriented. Mood is bright and cheerful feels fully detoxed,  Subjective/Objective: Wonders what will happen tomorrow. Wasn't able to have a bed Friday.Denies a need to change/adjust meds.    Filed Vitals:   08/28/11 0730  BP: 108/74  Pulse: 98  Temp:   Resp:     Lab Results:   BMET    Component Value Date/Time   NA 139 08/23/2011 1336   K 3.8 08/23/2011 1336   CL 104 08/23/2011 1336   CO2 25 08/23/2011 1336   GLUCOSE 93 08/23/2011 1336   BUN 11 08/23/2011 1336   CREATININE 0.66 08/23/2011 1336   CALCIUM 9.6 08/23/2011 1336   GFRNONAA >90 08/23/2011 1336   GFRAA >90 08/23/2011 1336    Medications:  Scheduled:     . cloNIDine  0.1 mg Oral BH-qamhs   Followed by  . cloNIDine  0.1 mg Oral QAC breakfast  . nicotine  21 mg Transdermal Deborah  . traZODone  50 mg Oral QHS,MR X 1     PRN Meds acetaminophen, alum & mag hydroxide-simeth, dicyclomine, hydrOXYzine, ibuprofen, loperamide, magnesium hydroxide, menthol-cetylpyridinium, methocarbamol, naproxen, ondansetron  Plan: continue current plan of care.  Reford Olliff,MICKIE D. 08/28/2011

## 2011-08-29 NOTE — Progress Notes (Addendum)
[facility] Group Notes:  (Counselor/Nursing/MHT/Case Management/Adjunct)  08/29/2011 5:00 PM   Type of Therapy:  Processing Group at 11:00 am  Participation Level:  Active  Participation Quality:  Appropriate   Affect:  Appropriate   Cognitive:  Appropriate   Insight:  Good  Engagement in Group:  Good  Engagement in Therapy:  Good  Modes of Intervention:  Exploration and support  Summary of Progress/Problems:  Deborah Russell shared some of her triggers involve "a kitchen spoon, music, a outfit of clothing, some of my friends and of course stress"  Pt also later shared that she has some drug paraphernalia hidden at home; pt willing to consider contacting brother and asking him to dispose of items.    [facility] Group Notes:  (Counselor/Nursing/MHT/Case Management/Adjunct)  08/29/2011 5:00 PM   Type of Therapy:  Counseling Group at 1:15 pm  Participation Level: Appropriate   Participation Quality:  Appropriate   Affect:  Appropriate   Cognitive:  Appropriate   Engagement in Group:  Appropriate  Modes of Intervention:  Education  Summary of Progress/Problems:  Pt was attentive to presentation re services offered by [facility]. Volunteer was a no show thus Clinical research associate presented.   Ronda Fairly, LCSWA 08/29/2011 5:00 PM

## 2011-08-29 NOTE — Progress Notes (Signed)
Pt was up in dayroom upon first assessment today.  She filled out her self-inventory and rated her depression 0 hopelessness a 1 today.  She rated her anxiety a 2 d/t ready for discharge.  She was wanting to go to [facility] today but no bed was available.  She said she and her CM has discussed other options if there is no bed for her tomorrow.  She has been active in the milieu today participating in groups and with her peers today.  No complaints voiced thus far.

## 2011-08-29 NOTE — Progress Notes (Signed)
Patient ID: Deborah Russell, female   DOB: 1990-09-29, 21 y.o.   MRN: [id number] Was out on hall this evening, attended group, watched the game on TV afterward. Has been interacting with select peers, pleasant but sad and feeling a little unsure of the future, but wants to get back on track.  Offered support and encouragement. Will continue to monitor.

## 2011-08-29 NOTE — [facility] Suicide Risk Assessment (Signed)
Suicide Risk Assessment  Discharge Assessment     Demographic factors:  See chart.  Current Mental Status:  Patient seen and evaluated. Chart reviewed. Patient stated that her mood was "good, much better". Her affect was mood congruent and euthymic. She denied any current thoughts of self injurious behavior, suicidal ideation or homicidal ideation. There were no auditory or visual hallucinations, paranoia, delusional thought processes, or mania noted.  Thought process was linear and goal directed.  No psychomotor agitation or retardation was noted. Speech was normal rate, tone and volume. Eye contact was good. Judgment and insight are improved.  Patient has been up and engaged on the unit.  No acute safety concerns reported from team.  Loss Factors: relationship with BF    Historical Factors: No hx reported SI/HI/Self injurious behavior/attempts/plans.  Risk Reduction Factors:  Risk Reduction Factors: Employed;Living with another person, especially a relative;Positive social support; lives with mother; interested in residential [place]  CLINICAL FACTORS: Opioid Dependence; Cannabis Dependence; Benzo Abuse; SIMD resolved; Migraines  COGNITIVE FEATURES THAT CONTRIBUTE TO RISK: limited insight.  SUICIDE RISK: Patient is currently viewed as a low risk of harm to herself and others in light of her history and risk factors. There are no acute safety concerns and she is stable for discharge. Her continued sobriety and followup Tx will mitigate against any potential risk in the future.   Meds: Scheduled Meds:   . cloNIDine  0.1 mg Oral BH-qamhs   Followed by  . cloNIDine  0.1 mg Oral QAC breakfast  . nicotine  21 mg Transdermal Daily  . traZODone  50 mg Oral QHS,MR X 1   PLAN OF CARE: Pt seen and evaluated.  Chart reviewed.  Pt stable for and requesting discharge. Pt contracting for safety and does not currently meet [place] involuntary commitment criteria for continued hospitalization.  Mental health  treatment and continued sobriety will mitigate against the increased risk of harm to self and/or others.  Discussed the importance of recovery further with pt, as well as, tools to move forward in a healthy & safe manner.  Pt agreeable with the plan.  Discussed with the team.  Please see orders, follow up plans per team and full discharge summary completed by physician extender.  D/c to [facility] pending in am.  Pt accepted.  Deborah Russell 08/29/2011, 5:07 PM

## 2011-08-29 NOTE — Progress Notes (Signed)
Pt attended discharge planning group and actively participated.  Pt presents with calm mood and affect.  Pt eager to d/c to treatment.  Pt denies depression, anxiety and SI.  SW contacted [facility] and a bed is not available today, probably tomorrow.  Pt was upset by this news and states she wants to d/c tomorrow regardless if a bed is available or not.  SW will assess for other referrals if pt is unable to go to [facility] tomorrow.  Safety planning and suicide prevention discussed.     Reyes Ivan, LCSWA 08/29/2011  12:14 PM

## 2011-08-30 DIAGNOSIS — F19239 Other psychoactive substance dependence with withdrawal, unspecified: Secondary | ICD-10-CM

## 2011-08-30 MED ORDER — TRAZODONE HCL 50 MG PO TABS
50.0000 mg | ORAL_TABLET | Freq: Every evening | ORAL | Status: DC | PRN
  Filled 2011-08-30 (×2): qty 28

## 2011-08-30 NOTE — Progress Notes (Signed)
[facility] Case Management Discharge Plan:  Will you be returning to the same living situation after discharge: No. pt staying with brother for safe environment until she gets into treatment At discharge, do you have transportation home?:Yes,  pt's brother to provide transportation home Do you have the ability to pay for your medications:Yes,  pt has access to meds  Interagency Information:   Release of information consent forms completed and in the chart; pt's signature needed at discharge.   Release of information consent forms completed and in the chart;  Patient's signature needed at discharge.  Patient to Follow up at:  Follow-up Information    Follow up with [facility] on 08/31/2011. (Request to start [facility] IOP!! Walk in clinic Monday - Friday 8-12 pm)    Contact information:   [address] [place], [place] [address] [phone]      Call [facility]. (Social worker will call you with appointment time tomorrow)    Contact information:   [address] [place], [place] [address] [phone]      Follow up with NA meetings.   Contact information:   see brochure for local meeting times and locations!         Patient denies SI/HI:   Yes,  pt denies    Aeronautical engineer and Suicide Prevention discussed:  Yes,  discussed with pt  Barrier to discharge identified:No.  Summary and Recommendations: Pt attended discharge planning group and actively participated.  Pt reports feeling stable to d/c today.  [facility] treatment bed was not available today.  Pt will d/c and stay with brother in clean and safe environment.  Pt will go to [facility] when treatment bed available.  [facility]'s phones are down today so SW was unable to secure a start date.  SW will arrange this and call pt after d/c.  No recommendations from SW.  No further needs voiced by pt.  Pt stable to discharge.     Carmina Miller 08/30/2011, 11:08 AM

## 2011-08-30 NOTE — Progress Notes (Signed)
Pt seen in the milieu and participating in unit activities.  She is cooperative.  Pt is a little anxious as to whether she will be able to discharge to [facility] tomorrow which depends on bed availability.  She wants to go tomorrow, so if a bed is not available, she may go to [facility] or her brother's house.  She voices no needs at this time.  Safety maintained with q15 minute checks.

## 2011-08-30 NOTE — Discharge Summary (Signed)
Deborah Russell 04-Jun-1991 21 y.o.  [id number]     Date of Admission:  08/24/2011 Date of Discharge:  08/30/2011 Diagnosis: AXIS I  opiate addiction, cannabis dependence, Benzo abuse, SIMD  AXIS II Deferred  AXIS III Past Medical History  Diagnosis Date  .         AXIS IV housing problems, occupational problems, problems related to social environment and problems with primary support group  AXIS V 51-60 moderate symptoms    HPI: Deborah Russell was admitted to [facility] from ED  where she presented with request for assisstence with detox from opiates.   Hospital Course:      The duration of Deborah Russell"s stay at [facility] was unremarkable.      The patient was seen and evaluated by the Treatment team consisting of Psychiatrist, PAC, RN, Case Manager, and Therapist for evaluation and treatment plan with goal of stabilization upon discharge. The patient's physical and mental health problems were identified and treated appropriately.      Multiple modalities of treatment were used including medication, individual and group therapies, unit programming, AA/NA, improved nutrition, physical activity, and family sessions as needed.     The symptoms of alcohol/substance abuse withdrawal were monitored daily by serial clinical withdrawal scores. Improvement was demonstrated by declining CIWA/COWS numbers, improving vital signs, increased cognition, and improvement in mood, sleep, appetite as well as a reduction in psychosocial symptoms.       The patient was evaluated and found to be stable enough for discharge and was released to her brother's girlfriend's home, until she can get into a residential rehab program per the initial plan of treatment.   Mental Status Exam:  For mental status exam please see mental status exam and  suicide risk assessment completed by attending physician prior to discharge. BP 96/68  Pulse 98  Temp(Src) 97.9 F (36.6 C) (Oral)  Resp 16  LMP 08/04/2011 Labs: Level of Care:  OP  Meds on  Discharge: Medication List  As of 08/30/2011  9:51 AM   ASK your doctor about these medications         ibuprofen 200 MG tablet   Commonly known as: ADVIL,MOTRIN   Take 400 mg by mouth every 8 (eight) hours as needed. For pain.           Is patient on multiple antipsychotic therapies at discharge:  No   Has Patient had three or more failed trials of antipsychotic monotherapy by history:  No  Follow-up recommendations:  Other:  continue to avoid relapse by attending 90 meetings in 90 days Discharge destination:  Home Comments: Please keep all follow up appointments as scheduled. Deborah Russell. Duran Ohern PAC for DR.Alyson Kuroski-MazzieMD 08/30/2011

## 2011-08-30 NOTE — Tx Team (Signed)
Interdisciplinary Treatment Plan Update (Adult)  Date:  08/30/2011  Time Reviewed:  10:17 AM   Progress in Treatment: Attending groups: Yes Participating in groups:  Yes Taking medication as prescribed: Yes Tolerating medication:  Yes Family/Significant othe contact made:  No, counselor asessing for contact Patient understands diagnosis:  Yes Discussing patient identified problems/goals with staff:  Yes Medical problems stabilized or resolved:  Yes Denies suicidal/homicidal ideation: Yes Issues/concerns per patient self-inventory:  None identified Other: N/A  New problem(s) identified: None Identified  Reason for Continuation of Hospitalization: Stable to d/c  Interventions implemented related to continuation of hospitalization: Stable to d/c  Additional comments: N/A  Estimated length of stay: D/C today  Discharge Plan: Pt will follow up with long term treatment at [facility] and stay with her brother in a safe environment until treatment  New goal(s): N/A  Review of initial/current patient goals per problem list:    1.  Goal(s): Address substance use  Met:  Yes  Target date: by discharge  As evidenced by: completed detox protocol and referred to appropriate treatment  2.  Goal (s): Reduce depressive symptoms  Met:  Yes  Target date: by discharge  As evidenced by: Reducing depression from a 10 to a 3 as reported by pt.  Pt denies depression.   3.  Goal(s): Reduce anxiety symptoms  Met:  Yes  Target date: by discharge  As evidenced by: Reducing anxiety from a 10 to a 3 as reported by pt.  Pt denies anxiety.     Attendees: Patient:  Deborah Russell 08/30/2011 10:22 AM   Family:     Physician:  Lupe Carney, DO 08/30/2011 10:17 AM   Nursing: Carolynn Comment, RN 08/30/2011 10:17 AM   Case Manager:  Reyes Ivan, LCSWA 08/30/2011  10:17 AM   Counselor:  Ronda Fairly, LCSWA 08/30/2011  10:17 AM   Other:  Richelle Ito, LCSW 08/30/2011 10:17  AM   Other:  Verne Spurr, PA 08/30/2011 10:22 AM   Other:  Izola Price, RN 08/30/2011 10:22 AM   Other:      Scribe for Treatment Team:   Reyes Ivan 08/30/2011 10:17 AM

## 2011-08-30 NOTE — Progress Notes (Signed)
Patient discharged to her brothers home.  All discharge instructions and follow up care reviewed with patient who verbalized understanding of all.  No discharge medications at this time.  All belongings returned from her locker.  Patient denies suicidal ideation, thoughts of self harm, or thoughts of harm to others.  Patient left the unit ambulatory with RN.  She was escorted to the front lobby where her ride was waiting for her.

## 2011-08-31 NOTE — Progress Notes (Signed)
Patient Discharge Instructions:  Admission Note Faxed,  08/31/2011 After Visit Summary Faxed,  08/31/2011 Faxed to the Next Level Care provider:  08/31/2011 D/C Summary faxed 08/31/2011 Facesheet faxed 08/31/2011  Faxed to [facility] @ [phone]  Wandra Scot, 08/31/2011, 3:44 PM

## 2011-12-07 ENCOUNTER — Emergency Department (INDEPENDENT_AMBULATORY_CARE_PROVIDER_SITE_OTHER): Payer: Medicaid Other

## 2011-12-07 ENCOUNTER — Emergency Department (HOSPITAL_BASED_OUTPATIENT_CLINIC_OR_DEPARTMENT_OTHER)
Admission: EM | Admit: 2011-12-07 | Discharge: 2011-12-07 | Disposition: A | Discharge status: Home / Self Care | Payer: Medicaid Other | Attending: Emergency Medicine | Admitting: Emergency Medicine

## 2011-12-07 ENCOUNTER — Encounter (HOSPITAL_BASED_OUTPATIENT_CLINIC_OR_DEPARTMENT_OTHER): Payer: Self-pay | Admitting: *Deleted

## 2011-12-07 DIAGNOSIS — B9689 Other specified bacterial agents as the cause of diseases classified elsewhere: Secondary | ICD-10-CM

## 2011-12-07 DIAGNOSIS — N760 Acute vaginitis: Secondary | ICD-10-CM | POA: Insufficient documentation

## 2011-12-07 DIAGNOSIS — O2340 Unspecified infection of urinary tract in pregnancy, unspecified trimester: Secondary | ICD-10-CM

## 2011-12-07 DIAGNOSIS — Z331 Pregnant state, incidental: Secondary | ICD-10-CM

## 2011-12-07 DIAGNOSIS — R1031 Right lower quadrant pain: Secondary | ICD-10-CM

## 2011-12-07 DIAGNOSIS — R109 Unspecified abdominal pain: Secondary | ICD-10-CM | POA: Insufficient documentation

## 2011-12-07 DIAGNOSIS — O2390 Unspecified genitourinary tract infection in pregnancy, unspecified trimester: Secondary | ICD-10-CM | POA: Insufficient documentation

## 2011-12-07 DIAGNOSIS — N390 Urinary tract infection, site not specified: Secondary | ICD-10-CM | POA: Insufficient documentation

## 2011-12-07 DIAGNOSIS — A499 Bacterial infection, unspecified: Secondary | ICD-10-CM | POA: Insufficient documentation

## 2011-12-07 LAB — CBC
HCT: 35.5 % — ABNORMAL LOW (ref 36.0–46.0)
MCV: 84.7 fL (ref 78.0–100.0)
Platelets: 223 10*3/uL (ref 150–400)
RBC: 4.19 MIL/uL (ref 3.87–5.11)
WBC: 7.6 10*3/uL (ref 4.0–10.5)

## 2011-12-07 LAB — RH IG WORKUP (INCLUDES ABO/RH): Gestational Age(Wks): 6

## 2011-12-07 LAB — URINALYSIS, ROUTINE W REFLEX MICROSCOPIC
Bilirubin Urine: NEGATIVE
Hgb urine dipstick: NEGATIVE
Specific Gravity, Urine: 1.016 (ref 1.005–1.030)
pH: 6.5 (ref 5.0–8.0)

## 2011-12-07 LAB — BASIC METABOLIC PANEL
BUN: 8 mg/dL (ref 6–23)
CO2: 25 mEq/L (ref 19–32)
Chloride: 104 mEq/L (ref 96–112)
Creatinine, Ser: 0.6 mg/dL (ref 0.50–1.10)

## 2011-12-07 LAB — URINE MICROSCOPIC-ADD ON

## 2011-12-07 MED ORDER — METRONIDAZOLE 500 MG PO TABS
2000.0000 mg | ORAL_TABLET | Freq: Once | ORAL | Status: AC
  Administered 2011-12-07: 2000 mg via ORAL
  Filled 2011-12-07: qty 4

## 2011-12-07 MED ORDER — NITROFURANTOIN MONOHYD MACRO 100 MG PO CAPS: 100.0000 mg | ORAL_CAPSULE | Freq: Two times a day (BID) | ORAL | Status: AC

## 2011-12-07 MED ORDER — PRENATAL COMPLETE 14-0.4 MG PO TABS: 1.0000 | ORAL_TABLET | Freq: Once | ORAL | Status: DC

## 2011-12-07 NOTE — ED Provider Notes (Signed)
History     CSN: [id number]  Arrival date & time 12/07/11  1316   First MD Initiated Contact with Patient 12/07/11 1343      Chief Complaint  Patient presents with  . Abdominal Pain    (Consider location/radiation/quality/duration/timing/severity/associated sxs/prior treatment) HPI Comments: Pt is having rlq pain  Patient is a 21 y.o. female presenting with abdominal pain. The history is provided by the patient. No language interpreter was used.  Abdominal Pain The primary symptoms of the illness include abdominal pain. The primary symptoms of the illness do not include fever, nausea, vomiting or vaginal discharge. The current episode started more than 2 days ago. The onset of the illness was gradual. The problem has not changed since onset. The patient states that she believes she is currently pregnant.    Past Medical History  Diagnosis Date  . Migraine     History reviewed. No pertinent past surgical history.  History reviewed. No pertinent family history.  History  Substance Use Topics  . Smoking status: Current Some Day Smoker -- 1.0 packs/day for 5 years    Types: Cigarettes  . Smokeless tobacco: Not on file  . Alcohol Use: No     occasionally    OB History    Grav Para Term Preterm Abortions TAB SAB Ect Mult Living                  Review of Systems  Constitutional: Negative for fever.  Respiratory: Negative.   Cardiovascular: Negative.   Gastrointestinal: Positive for abdominal pain. Negative for nausea and vomiting.  Genitourinary: Negative for vaginal discharge.  Neurological: Negative.     Allergies  Review of patient's allergies indicates no known allergies.  Home Medications  No current outpatient prescriptions on file.  BP 130/80  Pulse 90  Temp 98.4 F (36.9 C)  Resp 16  Ht 5\' 5"  (1.651 m)  Wt 135 lb (61.236 kg)  BMI 22.47 kg/m2  SpO2 100%  LMP 10/20/2011  Physical Exam  Nursing note and vitals reviewed. Constitutional: She is  oriented to person, place, and time. She appears well-developed and well-nourished.  HENT:  Head: Normocephalic and atraumatic.  Eyes: Conjunctivae and EOM are normal.  Neck: Neck supple.  Cardiovascular: Normal rate and regular rhythm.   Pulmonary/Chest: Effort normal and breath sounds normal.  Abdominal: Soft. Bowel sounds are normal. There is tenderness.  Genitourinary: Vaginal discharge found.       Right sided tenderness  Musculoskeletal: Normal range of motion.  Neurological: She is alert and oriented to person, place, and time.  Skin: Skin is warm and dry.    ED Course  Procedures (including critical care time)  Labs Reviewed  URINALYSIS, ROUTINE W REFLEX MICROSCOPIC - Abnormal; Notable for the following:    Leukocytes, UA MODERATE (*)    All other components within normal limits  PREGNANCY, URINE - Abnormal; Notable for the following:    Preg Test, Ur POSITIVE (*)    All other components within normal limits  URINE MICROSCOPIC-ADD ON - Abnormal; Notable for the following:    Bacteria, UA MANY (*)    All other components within normal limits  WET PREP, GENITAL - Abnormal; Notable for the following:    Clue Cells Wet Prep HPF POC MODERATE (*)    WBC, Wet Prep HPF POC MANY (*)    All other components within normal limits  HCG, QUANTITATIVE, PREGNANCY - Abnormal; Notable for the following:    hCG, Beta Chain, Quant, S  16109 (*)    All other components within normal limits  CBC - Abnormal; Notable for the following:    HCT 35.5 (*)    MCHC 36.1 (*)    All other components within normal limits  BASIC METABOLIC PANEL  RH IG WORKUP (INCLUDES ABO/RH)  GC/CHLAMYDIA PROBE AMP, GENITAL   US Ob Comp Less 14 Wks  12/07/2011  *RADIOLOGY REPORT*  Clinical Data: Right lower quadrant pain with positive pregnancy test.  OBSTETRIC <14 WK [place] AND TRANSVAGINAL OB US  Technique:  Both transabdominal and transvaginal ultrasound examinations were performed for complete evaluation of the  gestation as well as the maternal uterus, adnexal regions, and pelvic cul-de-sac.  Transvaginal technique was performed to assess early pregnancy.  Comparison:  None.  Intrauterine gestational sac:  single sac visualized. Yolk sac: Visualized Embryo: Visualized Cardiac Activity: Visualized Heart Rate: 135 bpm  CRL: 9.6   mm  7   w  0   d         [place] EDC: 07/26/2012  Maternal uterus/adnexae: Right ovary is unremarkable.  17 cm complex cystic area the left ovary is promptly the corpus luteum cyst.  No evidence for subchorionic hemorrhage.  Trace free fluid is noted in the cul-de- sac.  IMPRESSION: Single living intrauterine gestation at estimated 7-week-0-day gestational age by crown-rump length.  Original Report Authenticated By: ERIC A. MANSELL, M.D.   US Ob Transvaginal  12/07/2011  *RADIOLOGY REPORT*  Clinical Data: Right lower quadrant pain with positive pregnancy test.  OBSTETRIC <14 WK [place] AND TRANSVAGINAL OB US  Technique:  Both transabdominal and transvaginal ultrasound examinations were performed for complete evaluation of the gestation as well as the maternal uterus, adnexal regions, and pelvic cul-de-sac.  Transvaginal technique was performed to assess early pregnancy.  Comparison:  None.  Intrauterine gestational sac:  single sac visualized. Yolk sac: Visualized Embryo: Visualized Cardiac Activity: Visualized Heart Rate: 135 bpm  CRL: 9.6   mm  7   w  0   d         [place] EDC: 07/26/2012  Maternal uterus/adnexae: Right ovary is unremarkable.  17 cm complex cystic area the left ovary is promptly the corpus luteum cyst.  No evidence for subchorionic hemorrhage.  Trace free fluid is noted in the cul-de- sac.  IMPRESSION: Single living intrauterine gestation at estimated 7-week-0-day gestational age by crown-rump length.  Original Report Authenticated By: ERIC A. MANSELL, M.D.     1. UTI in pregnancy   2. BV (bacterial vaginosis)       MDM  Pt has a single iup:pt treated for bv here and will treat for  uti:pt is to follow up with ob:pt given a script for prenatal vitamins        Teressa Lower, NP 12/07/11 1622

## 2011-12-07 NOTE — ED Notes (Signed)
LMP march 14

## 2011-12-07 NOTE — ED Notes (Signed)
Pt c/o pelvic abd pain x 1 week. Pt states she is pregnant. Pt is a resident at [facility].

## 2011-12-07 NOTE — Discharge Instructions (Signed)
Bacterial Vaginosis Bacterial vaginosis (BV) is a vaginal infection where the normal balance of bacteria in the vagina is disrupted. The normal balance is then replaced by an overgrowth of certain bacteria. There are several different kinds of bacteria that can cause BV. BV is the most common vaginal infection in women of childbearing age. CAUSES   The cause of BV is not fully understood. BV develops when there is an increase or imbalance of harmful bacteria.   Some activities or behaviors can upset the normal balance of bacteria in the vagina and put women at increased risk including:   Having a new sex partner or multiple sex partners.   Douching.   Using an intrauterine device (IUD) for contraception.   It is not clear what role sexual activity plays in the development of BV. However, women that have never had sexual intercourse are rarely infected with BV.  Women do not get BV from toilet seats, bedding, swimming pools or from touching objects around them.  SYMPTOMS   Grey vaginal discharge.   A fish-like odor with discharge, especially after sexual intercourse.   Itching or burning of the vagina and vulva.   Burning or pain with urination.   Some women have no signs or symptoms at all.  DIAGNOSIS  Your caregiver must examine the vagina for signs of BV. Your caregiver will perform lab tests and look at the sample of vaginal fluid through a microscope. They will look for bacteria and abnormal cells (clue cells), a pH test higher than 4.5, and a positive amine test all associated with BV.  RISKS AND COMPLICATIONS   Pelvic inflammatory disease (PID).   Infections following gynecology surgery.   Developing HIV.   Developing herpes virus.  TREATMENT  Sometimes BV will clear up without treatment. However, all women with symptoms of BV should be treated to avoid complications, especially if gynecology surgery is planned. Female partners generally do not need to be treated. However,  BV may spread between female sex partners so treatment is helpful in preventing a recurrence of BV.   BV may be treated with antibiotics. The antibiotics come in either pill or vaginal cream forms. Either can be used with nonpregnant or pregnant women, but the recommended dosages differ. These antibiotics are not harmful to the baby.   BV can recur after treatment. If this happens, a second round of antibiotics will often be prescribed.   Treatment is important for pregnant women. If not treated, BV can cause a premature delivery, especially for a pregnant woman who had a premature birth in the past. All pregnant women who have symptoms of BV should be checked and treated.   For chronic reoccurrence of BV, treatment with a type of prescribed gel vaginally twice a week is helpful.  HOME CARE INSTRUCTIONS   Finish all medication as directed by your caregiver.   Do not have sex until treatment is completed.   Tell your sexual partner that you have a vaginal infection. They should see their caregiver and be treated if they have problems, such as a mild rash or itching.   Practice safe sex. Use condoms. Only have 1 sex partner.  PREVENTION  Basic prevention steps can help reduce the risk of upsetting the natural balance of bacteria in the vagina and developing BV:  Do not have sexual intercourse (be abstinent).   Do not douche.   Use all of the medicine prescribed for treatment of BV, even if the signs and symptoms go away.     Tell your sex partner if you have BV. That way, they can be treated, if needed, to prevent reoccurrence.  SEEK MEDICAL CARE IF:   Your symptoms are not improving after 3 days of treatment.   You have increased discharge, pain, or fever.  MAKE SURE YOU:   Understand these instructions.   Will watch your condition.   Will get help right away if you are not doing well or get worse.  FOR MORE INFORMATION  Division of STD Prevention (DSTDP), Centers for Disease  Control and Prevention: SolutionApps.[employer].za American Social Health Association (ASHA): www.ashastd.org  Document Released: 07/25/2005 Document Revised: 07/14/2011 Document Reviewed: 01/15/2009 [facility] Patient Information 2012 [place], [place].Urinary Tract Infection A urinary tract infection (UTI) is often caused by a germ (bacteria). A UTI is usually helped with medicine (antibiotics) that kills germs. Take all the medicine until it is gone. Do this even if you are feeling better. You are usually better in 7 to 10 days. HOME CARE   Drink enough water and fluids to keep your pee (urine) clear or pale yellow. Drink:   Cranberry juice.   Water.   Avoid:   Caffeine.   Tea.   Bubbly (carbonated) drinks.   Alcohol.   Only take medicine as told by your doctor.   To prevent further infections:   Pee often.   After pooping (bowel movement), women should wipe from front to back. Use each tissue only once.   Pee before and after having sex (intercourse).  Ask your doctor when your test results will be ready. Make sure you follow up and get your test results.  GET HELP RIGHT AWAY IF:   There is very bad back pain or lower belly (abdominal) pain.   You get the chills.   You have a fever.   Your baby is older than 3 months with a rectal temperature of 102 F (38.9 C) or higher.   Your baby is 98 months old or younger with a rectal temperature of 100.4 F (38 C) or higher.   You feel sick to your stomach (nauseous) or throw up (vomit).   There is continued burning with peeing.   Your problems are not better in 3 days. Return sooner if you are getting worse.  MAKE SURE YOU:   Understand these instructions.   Will watch your condition.   Will get help right away if you are not doing well or get worse.  Document Released: 01/11/2008 Document Revised: 07/14/2011 Document Reviewed: 01/11/2008 [facility] Patient Information 2012 [place], [place].

## 2011-12-08 LAB — GC/CHLAMYDIA PROBE AMP, GENITAL: GC Probe Amp, Genital: NEGATIVE

## 2011-12-08 NOTE — ED Provider Notes (Signed)
Medical screening examination/treatment/procedure(s) were performed by non-physician practitioner and as supervising physician I was immediately available for consultation/collaboration.   Forbes Cellar, MD 12/08/11 [phone]

## 2011-12-09 LAB — URINE CULTURE: Culture: NO GROWTH

## 2011-12-30 ENCOUNTER — Encounter (HOSPITAL_COMMUNITY): Payer: Self-pay

## 2012-01-13 LAB — OB RESULTS CONSOLE ABO/RH: RH Type: POSITIVE

## 2012-01-13 LAB — OB RESULTS CONSOLE RUBELLA ANTIBODY, IGM: Rubella: IMMUNE

## 2012-01-13 LAB — OB RESULTS CONSOLE RPR: RPR: NONREACTIVE

## 2012-01-13 LAB — OB RESULTS CONSOLE ANTIBODY SCREEN: Antibody Screen: NEGATIVE

## 2012-01-16 ENCOUNTER — Encounter: Payer: Self-pay | Admitting: Family Medicine

## 2012-01-16 DIAGNOSIS — Z3400 Encounter for supervision of normal first pregnancy, unspecified trimester: Secondary | ICD-10-CM

## 2012-02-06 ENCOUNTER — Encounter (HOSPITAL_BASED_OUTPATIENT_CLINIC_OR_DEPARTMENT_OTHER): Payer: Self-pay | Admitting: *Deleted

## 2012-02-06 ENCOUNTER — Emergency Department (HOSPITAL_BASED_OUTPATIENT_CLINIC_OR_DEPARTMENT_OTHER)
Admission: EM | Admit: 2012-02-06 | Discharge: 2012-02-07 | Disposition: A | Discharge status: Home / Self Care | Payer: Medicaid Other | Attending: Emergency Medicine | Admitting: Emergency Medicine

## 2012-02-06 ENCOUNTER — Emergency Department (HOSPITAL_BASED_OUTPATIENT_CLINIC_OR_DEPARTMENT_OTHER): Payer: Medicaid Other

## 2012-02-06 DIAGNOSIS — O4690 Antepartum hemorrhage, unspecified, unspecified trimester: Secondary | ICD-10-CM | POA: Insufficient documentation

## 2012-02-06 LAB — URINALYSIS, ROUTINE W REFLEX MICROSCOPIC
Bilirubin Urine: NEGATIVE
Glucose, UA: NEGATIVE mg/dL
Specific Gravity, Urine: 1.023 (ref 1.005–1.030)
Urobilinogen, UA: 0.2 mg/dL (ref 0.0–1.0)

## 2012-02-06 NOTE — ED Notes (Signed)
Pt was having sexual intercourse tonight when she had a sudden onset of vaginal bleeding. Bright red blood. Moderate amount per pt. Denies abdominal pain. Pt is [redacted]weeks pregnant. Pt sees [facility]. Last [place] was June 7th. Pt reported normal. Next exam is this Friday for lab work.

## 2012-02-07 LAB — CBC WITH DIFFERENTIAL/PLATELET
HCT: 32.1 % — ABNORMAL LOW (ref 36.0–46.0)
Hemoglobin: 11.6 g/dL — ABNORMAL LOW (ref 12.0–15.0)
Lymphocytes Relative: 19 % (ref 12–46)
Lymphs Abs: 1.4 10*3/uL (ref 0.7–4.0)
MCHC: 36.1 g/dL — ABNORMAL HIGH (ref 30.0–36.0)
Monocytes Absolute: 0.6 10*3/uL (ref 0.1–1.0)
Monocytes Relative: 8 % (ref 3–12)
Neutro Abs: 5.5 10*3/uL (ref 1.7–7.7)

## 2012-02-07 LAB — WET PREP, GENITAL
Trich, Wet Prep: NONE SEEN
Yeast Wet Prep HPF POC: NONE SEEN

## 2012-02-07 LAB — BASIC METABOLIC PANEL
BUN: 12 mg/dL (ref 6–23)
CO2: 21 mEq/L (ref 19–32)
Chloride: 102 mEq/L (ref 96–112)
Creatinine, Ser: 0.6 mg/dL (ref 0.50–1.10)
Glucose, Bld: 89 mg/dL (ref 70–99)

## 2012-02-07 NOTE — ED Provider Notes (Signed)
History   This chart was scribed for Deborah Russell [name], MD by Deborah Russell. The patient was seen in room MH04/MH04. Patient's care was started at 2223.     CSN: [id number]  Arrival date & time 02/06/12  2223   First MD Initiated Contact with Patient 02/06/12 2316      Chief Complaint  Patient presents with  . Vaginal Bleeding    (Consider location/radiation/quality/duration/timing/severity/associated sxs/prior treatment) Patient is a 21 y.o. female presenting with vaginal bleeding. The history is provided by the patient. No language interpreter was used.  Vaginal Bleeding This is a new problem. The current episode started 1 to 2 hours ago. The problem occurs rarely. The problem has been gradually improving. Pertinent negatives include no chest pain, no abdominal pain, no headaches and no shortness of breath. Nothing aggravates the symptoms. Nothing relieves the symptoms. She has tried nothing for the symptoms.   Deborah Russell is a 21 y.o. female who presents to the Emergency Department complaining of vaginal bleeding onset a couple of hours ago during sexual intercourse. Patient says that that there was a moderate amount of bright red blood. Patient denies abdominal pain. Patient is [redacted] weeks pregnant, confirmed by Deborah Russell. Last Deborah Russell was 01/13/2012 during which patient reported normal. Next exam is 02/10/2012 for lab work. Patient is followed by [facility]. Patient with h/o migraine. Patient is a former smoker (1pack/day for 5 years).  Past Medical History  Diagnosis Date  . Migraine     History reviewed. No pertinent past surgical history.  No family history on file.  History  Substance Use Topics  . Smoking status: Former Smoker -- 1.0 packs/day for 5 years    Types: Cigarettes  . Smokeless tobacco: Not on file  . Alcohol Use: No     occasionally    OB History    Grav Para Term Preterm Abortions TAB SAB Ect Mult Living   1               Review of Systems  Respiratory:  Negative for shortness of breath.   Cardiovascular: Negative for chest pain.  Gastrointestinal: Negative for abdominal pain.  Genitourinary: Positive for vaginal bleeding. Negative for frequency, flank pain, vaginal discharge, vaginal pain and pelvic pain.  Neurological: Negative for headaches.  All other systems reviewed and are negative.    Allergies  Review of patient's allergies indicates no known allergies.  Home Medications   Current Outpatient Rx  Name Route Sig Dispense Refill  . IBUPROFEN 200 MG PO TABS Oral Take 400 mg by mouth every 6 (six) hours as needed. Patient used this medication for her headache.    Marland Kitchen PRENATAL COMPLETE 14-0.4 MG PO TABS Oral Take 1 tablet by mouth once. 60 each 2    BP 122/81  Pulse 80  Temp 98.1 F (36.7 C) (Oral)  Resp 18  Ht 5\' 5"  (1.651 m)  Wt 140 lb (63.504 kg)  BMI 23.30 kg/m2  SpO2 100%  LMP 10/20/2011  Physical Exam  Constitutional: She is oriented to person, place, and time. She appears well-developed and well-nourished.  HENT:  Head: Normocephalic and atraumatic.  Right Ear: External ear normal.  Left Ear: External ear normal.  Mouth/Throat: Oropharynx is clear and moist. No oropharyngeal exudate.  Eyes: Conjunctivae and EOM are normal. Pupils are equal, round, and reactive to light.  Neck: Normal range of motion. Neck supple. No tracheal deviation present.  Cardiovascular: Normal rate, regular rhythm and normal heart sounds.   No murmur  heard. Pulmonary/Chest: Effort normal and breath sounds normal. No respiratory distress. She has no wheezes.  Abdominal: Soft. Bowel sounds are normal. She exhibits no distension. There is no tenderness. There is no rebound.  Genitourinary: Uterus normal. Cervix exhibits no motion tenderness. There is bleeding (Scant vaginal bleeding) around the vagina. No tenderness around the vagina.       Cervical os closed.  Chaperone  Musculoskeletal: Normal range of motion.  Lymphadenopathy:    She has  no cervical adenopathy.  Neurological: She is alert and oriented to person, place, and time. She displays normal reflexes.    ED Course  Procedures (including critical care time) DIAGNOSTIC STUDIES: Oxygen Saturation is 100% on room air, normal by my interpretation.    COORDINATION OF CARE: 11:37PM- Patient informed of current plan for treatment and evaluation and agrees with plan at this time.   12:42AM- Performed consult with Dr. Senaida Ores at [facility]. Patient's case was explained and discussed.  Results for orders placed during the hospital encounter of 02/06/12  URINALYSIS, ROUTINE W REFLEX MICROSCOPIC      Component Value Range   Color, Urine YELLOW  YELLOW   APPearance CLEAR  CLEAR   Specific Gravity, Urine 1.023  1.005 - 1.030   pH 6.0  5.0 - 8.0   Glucose, UA NEGATIVE  NEGATIVE mg/dL   Hgb urine dipstick LARGE (*) NEGATIVE   Bilirubin Urine NEGATIVE  NEGATIVE   Ketones, ur NEGATIVE  NEGATIVE mg/dL   Protein, ur NEGATIVE  NEGATIVE mg/dL   Urobilinogen, UA 0.2  0.0 - 1.0 mg/dL   Nitrite NEGATIVE  NEGATIVE   Leukocytes, UA NEGATIVE  NEGATIVE  PREGNANCY, URINE      Component Value Range   Preg Test, Ur POSITIVE (*) NEGATIVE  WET PREP, GENITAL      Component Value Range   Yeast Wet Prep HPF POC NONE SEEN  NONE SEEN   Trich, Wet Prep NONE SEEN  NONE SEEN   Clue Cells Wet Prep HPF POC FEW (*) NONE SEEN   WBC, Wet Prep HPF POC FEW (*) NONE SEEN  URINE MICROSCOPIC-ADD ON      Component Value Range   Squamous Epithelial / LPF RARE  RARE   WBC, UA 0-2  <3 WBC/hpf   RBC / HPF 11-20  <3 RBC/hpf  CBC WITH DIFFERENTIAL      Component Value Range   WBC 7.6  4.0 - 10.5 K/uL   RBC 3.84 (*) 3.87 - 5.11 MIL/uL   Hemoglobin 11.6 (*) 12.0 - 15.0 g/dL   HCT 16.1 (*) 09.6 - 04.5 %   MCV 83.6  78.0 - 100.0 fL   MCH 30.2  26.0 - 34.0 pg   MCHC 36.1 (*) 30.0 - 36.0 g/dL   RDW 40.9  81.1 - 91.4 %   Platelets 177  150 - 400 K/uL   Neutrophils Relative 72  43 - 77 %    Neutro Abs 5.5  1.7 - 7.7 K/uL   Lymphocytes Relative 19  12 - 46 %   Lymphs Abs 1.4  0.7 - 4.0 K/uL   Monocytes Relative 8  3 - 12 %   Monocytes Absolute 0.6  0.1 - 1.0 K/uL   Eosinophils Relative 1  0 - 5 %   Eosinophils Absolute 0.1  0.0 - 0.7 K/uL   Basophils Relative 0  0 - 1 %   Basophils Absolute 0.0  0.0 - 0.1 K/uL   Deborah Russell Ob Limited  02/07/2012  *RADIOLOGY  REPORT*  Clinical Data: Vaginal bleeding.  LIMITED OBSTETRIC ULTRASOUND  Number of Fetuses: 1 Heart Rate: 149 bpm Movement: Yes Presentation: Cephalic Placental Location: Posterior Previa: No Amniotic Fluid (Subjective): Normal  Vertical pocket:  4.4cm AFI: 11.9 cm  BPD: 3.4cm   16w   4d   EDC: 07/26/2012  MATERNAL FINDINGS: Cervix: Closed Uterus/Adnexae: Grossly unremarkable.  IMPRESSION: 16-week-4-day intrauterine pregnancy.  Fetal heart rate 149 beats per minute.  Recommend followup with non-emergent complete OB 14+ wk Deborah Russell examination for fetal biometric evaluation and anatomic survey if not already performed.  Original Report Authenticated By: Cyndie Chime, M.D.     No diagnosis found.    MDM  1235 case d/w Dr. Senaida Ores without symptoms no need to treat a few clue cells follow up in office.   No need for rhogam as patient is O+.        I personally performed the services described in this documentation, which was scribed in my presence. The recorded information has been reviewed and considered.    Jasmine Awe, MD 02/07/12 [phone]

## 2012-02-07 NOTE — Discharge Instructions (Signed)
Pelvic Rest Pelvic rest is sometimes recommended for women when:   The placenta is partially or completely covering the opening of the cervix (placenta previa).   There is bleeding between the uterine wall and the amniotic sac in the first trimester (subchorionic hemorrhage).   The cervix begins to open without labor starting (incompetent cervix, cervical insufficiency).   The labor is too early (preterm labor).  HOME CARE INSTRUCTIONS  Do not have sexual intercourse, stimulation, or an orgasm.   Do not use tampons, douche, or put anything in the vagina.   Do not lift anything over 10 pounds (4.5 kg).   Avoid strenuous activity or straining your pelvic muscles.  SEEK MEDICAL CARE IF:  You have any vaginal bleeding during pregnancy. Treat this as a potential emergency.   You have cramping pain felt low in the stomach (stronger than menstrual cramps).   You notice vaginal discharge (watery, mucus, or bloody).   You have a low, dull backache.   There are regular contractions or uterine tightening.  SEEK IMMEDIATE MEDICAL CARE IF: You have vaginal bleeding and have placenta previa.  Document Released: 11/19/2010 Document Revised: 07/14/2011 Document Reviewed: 11/19/2010 [employer] Patient Information 2012 [employer]. 

## 2012-02-08 LAB — GC/CHLAMYDIA PROBE AMP, GENITAL: GC Probe Amp, Genital: NEGATIVE

## 2012-06-25 LAB — OB RESULTS CONSOLE GBS: GBS: NEGATIVE

## 2012-07-19 ENCOUNTER — Telehealth (HOSPITAL_COMMUNITY): Payer: Self-pay | Admitting: *Deleted

## 2012-07-19 NOTE — Telephone Encounter (Signed)
Preadmission screen  

## 2012-07-20 ENCOUNTER — Telehealth (HOSPITAL_COMMUNITY): Payer: Self-pay | Admitting: *Deleted

## 2012-07-20 ENCOUNTER — Encounter (HOSPITAL_COMMUNITY): Payer: Self-pay | Admitting: *Deleted

## 2012-07-20 NOTE — Telephone Encounter (Signed)
Preadmission screen  

## 2012-07-24 ENCOUNTER — Other Ambulatory Visit: Payer: Self-pay | Admitting: Obstetrics and Gynecology

## 2012-07-24 NOTE — H&P (Signed)
Deborah, Russell NO.:  [id number]  MEDICAL RECORD NO.:  [id number]  LOCATION:                                 FACILITY:  PHYSICIAN:  Malachi Pro. Ambrose Mantle, M.D. DATE OF BIRTH:  27-Dec-1990  DATE OF ADMISSION:  07/25/2012 DATE OF DISCHARGE:                             HISTORY & PHYSICAL   PRESENT ILLNESS:  A 21 year old white female, para 0-0-1-0, gravida 2, [facility] July 26, 2012, based on an ultrasound at 12 weeks and 2 days done on January 13, 2012.  The patient is admitted for induction of labor. Blood group and type O positive.  Negative antibody.  Pap smear normal. Rubella immune.  RPR nonreactive.  Urine culture negative.  Hepatitis B surface antigen negative, HIV negative, GC and Chlamydia negative. First trimester screen, cystic fibrosis screen and AFP all negative.  A 1-hour Glucola 99.  Group B strep negative.  The patient began her prenatal course in our office at 14 weeks and 2 days.  She has had somewhat uncomplicated pregnancy.  Total weight gain has been 25 pounds. Blood pressure has remained normal throughout and she is admitted now for induction of labor.  ALLERGIES:  Reveals no known allergies.  She has no latex allergy.  PAST MEDICAL HISTORY:  She has had a history of  Chlamydia, had a termination of pregnancy in 2009.  FAMILY MEDICAL HISTORY:  Maternal grandmother with high blood pressure. Paternal grandfather with throat cancer  and bladder cancer.  SOCIAL HISTORY:  The patient is active without a formal exercise program.  She denies alcohol, tobacco and illicit substance abuse.  She is single.  She is unemployed.  PHYSICAL EXAMINATION:  VITAL SIGNS:  On admission reveals a well- developed, well-nourished, white female, in no distress.  Blood pressure is 108/70, weight is 159, pulse is 80. HEAD, EYES, NOSE, AND THROAT:  Normal. HEART:  Normal size and sounds.  No murmurs. LUNGS:  Clear to auscultation. PELVIC:  Fundal height is 38.5  cm.   Fetal heart tones are normal. Cervix is a fingertip dilated, 20-30% effaced, the vertex at a -2.  When I examined the cervix there was a little blood, but this did not continue.  ADMITTING IMPRESSION:  Intrauterine pregnancy at 40 weeks.  The patient is admitted for induction of labor.     Malachi Pro. Ambrose Mantle, M.D.    TFH/MEDQ  D:  07/24/2012  T:  07/24/2012  Job:  [id number]

## 2012-07-25 ENCOUNTER — Encounter (HOSPITAL_COMMUNITY): Payer: Self-pay | Admitting: Anesthesiology

## 2012-07-25 ENCOUNTER — Encounter (HOSPITAL_COMMUNITY): Payer: Self-pay

## 2012-07-25 ENCOUNTER — Inpatient Hospital Stay (HOSPITAL_COMMUNITY)
Admission: RE | Admit: 2012-07-25 | Discharge: 2012-07-27 | DRG: 775 | Disposition: A | Discharge status: Home / Self Care | Payer: Medicaid Other | Source: Ambulatory Visit | Attending: Obstetrics and Gynecology | Admitting: Obstetrics and Gynecology

## 2012-07-25 ENCOUNTER — Inpatient Hospital Stay (HOSPITAL_COMMUNITY): Payer: Medicaid Other | Admitting: Anesthesiology

## 2012-07-25 LAB — CBC
MCV: 78.2 fL (ref 78.0–100.0)
MCV: 77.5 fL — ABNORMAL LOW (ref 78.0–100.0)
Platelets: 142 10*3/uL — ABNORMAL LOW (ref 150–400)
Platelets: 136 10*3/uL — ABNORMAL LOW (ref 150–400)
RDW: 13.6 % (ref 11.5–15.5)
RDW: 13.5 % (ref 11.5–15.5)
WBC: 7.1 10*3/uL (ref 4.0–10.5)
WBC: 14.6 10*3/uL — ABNORMAL HIGH (ref 4.0–10.5)

## 2012-07-25 LAB — RPR: RPR Ser Ql: NONREACTIVE

## 2012-07-25 MED ORDER — BUTORPHANOL TARTRATE 1 MG/ML IJ SOLN
1.0000 mg | Freq: Once | INTRAMUSCULAR | Status: AC
  Administered 2012-07-25: 1 mg via INTRAVENOUS
  Filled 2012-07-25: qty 1

## 2012-07-25 MED ORDER — LACTATED RINGERS IV SOLN
500.0000 mL | INTRAVENOUS | Status: DC | PRN
  Administered 2012-07-25: 500 mL via INTRAVENOUS

## 2012-07-25 MED ORDER — OXYTOCIN 40 UNITS IN LACTATED RINGERS INFUSION - SIMPLE MED
62.5000 mL/h | INTRAVENOUS | Status: DC
  Filled 2012-07-25 (×2): qty 1000

## 2012-07-25 MED ORDER — LIDOCAINE HCL (PF) 1 % IJ SOLN
INTRAMUSCULAR | Status: DC | PRN
  Administered 2012-07-25 (×2): 4 mL

## 2012-07-25 MED ORDER — DIPHENHYDRAMINE HCL 50 MG/ML IJ SOLN: 12.5000 mg | INTRAMUSCULAR | Status: DC | PRN

## 2012-07-25 MED ORDER — EPHEDRINE 5 MG/ML INJ: 10.0000 mg | INTRAVENOUS | Status: DC | PRN

## 2012-07-25 MED ORDER — TERBUTALINE SULFATE 1 MG/ML IJ SOLN: 0.2500 mg | Freq: Once | INTRAMUSCULAR | Status: DC | PRN

## 2012-07-25 MED ORDER — OXYTOCIN 40 UNITS IN LACTATED RINGERS INFUSION - SIMPLE MED
1.0000 m[IU]/min | INTRAVENOUS | Status: DC
  Administered 2012-07-25: 17 m[IU]/min via INTRAVENOUS
  Administered 2012-07-25: 1 m[IU]/min via INTRAVENOUS

## 2012-07-25 MED ORDER — LACTATED RINGERS IV SOLN: 500.0000 mL | Freq: Once | INTRAVENOUS | Status: DC

## 2012-07-25 MED ORDER — PHENYLEPHRINE 40 MCG/ML (10ML) SYRINGE FOR IV PUSH (FOR BLOOD PRESSURE SUPPORT): 80.0000 ug | PREFILLED_SYRINGE | INTRAVENOUS | Status: DC | PRN

## 2012-07-25 MED ORDER — PHENYLEPHRINE 40 MCG/ML (10ML) SYRINGE FOR IV PUSH (FOR BLOOD PRESSURE SUPPORT)
80.0000 ug | PREFILLED_SYRINGE | INTRAVENOUS | Status: DC | PRN
  Filled 2012-07-25: qty 5

## 2012-07-25 MED ORDER — FENTANYL 2.5 MCG/ML BUPIVACAINE 1/10 % EPIDURAL INFUSION (WH - ANES)
14.0000 mL/h | INTRAMUSCULAR | Status: DC
  Filled 2012-07-25: qty 125

## 2012-07-25 MED ORDER — IBUPROFEN 600 MG PO TABS: 600.0000 mg | ORAL_TABLET | Freq: Four times a day (QID) | ORAL | Status: DC | PRN

## 2012-07-25 MED ORDER — LIDOCAINE HCL (PF) 1 % IJ SOLN
30.0000 mL | INTRAMUSCULAR | Status: DC | PRN
  Filled 2012-07-25: qty 30

## 2012-07-25 MED ORDER — OXYTOCIN BOLUS FROM INFUSION: 500.0000 mL | INTRAVENOUS | Status: DC

## 2012-07-25 MED ORDER — OXYCODONE-ACETAMINOPHEN 5-325 MG PO TABS: 1.0000 | ORAL_TABLET | ORAL | Status: DC | PRN

## 2012-07-25 MED ORDER — LACTATED RINGERS IV SOLN
INTRAVENOUS | Status: DC
  Administered 2012-07-25 (×2): via INTRAVENOUS

## 2012-07-25 MED ORDER — FENTANYL 2.5 MCG/ML BUPIVACAINE 1/10 % EPIDURAL INFUSION (WH - ANES)
INTRAMUSCULAR | Status: DC | PRN
  Administered 2012-07-25: 14 mL/h via EPIDURAL

## 2012-07-25 MED ORDER — EPHEDRINE 5 MG/ML INJ
10.0000 mg | INTRAVENOUS | Status: DC | PRN
  Filled 2012-07-25: qty 4

## 2012-07-25 NOTE — Progress Notes (Signed)
Patient ID: Deborah Russell, female   DOB: 1991/06/02, 21 y.o.   MRN: [id number] Delivery note:  The pt was thought to be 4 cm and thick close to 6 PM. She made very rapid progress to full dilatation. She delivered spontaneously OA over an intact perineum a living female infant with Apgars of 9 and 10 at 1 and 5 minutes. The placenta delivered intact and the uterus was normal There were bilateral labial lacerations. The right was bleeding and was repaired and the left was hemostatic and not repaired. The perineum was intact. EBL 400 cc's

## 2012-07-25 NOTE — Progress Notes (Signed)
Patient ID: Deborah Russell, female   DOB: 1991/06/24, 21 y.o.   MRN: [id number] Pitocin is at 20 mu/ minute and the contractions are sometimes closer than q 2 minutes. She has an epidural and is comfortable.

## 2012-07-25 NOTE — Anesthesia Preprocedure Evaluation (Signed)
Anesthesia Evaluation  Patient identified by MRN, date of birth, ID band Patient awake    Reviewed: Allergy & Precautions, H&P , Patient's Chart, lab work & pertinent test results  Airway Mallampati: III TM Distance: >3 FB Neck ROM: full    Dental No notable dental hx. (+) Teeth Intact   Pulmonary neg pulmonary ROS,  breath sounds clear to auscultation  Pulmonary exam normal       Cardiovascular negative cardio ROS  Rhythm:regular Rate:Normal     Neuro/Psych  Headaches, negative psych ROS   GI/Hepatic negative GI ROS, Neg liver ROS,   Endo/Other  negative endocrine ROS  Renal/GU negative Renal ROS  negative genitourinary   Musculoskeletal   Abdominal Normal abdominal exam  (+)   Peds  Hematology negative hematology ROS (+)   Anesthesia Other Findings   Reproductive/Obstetrics (+) Pregnancy                           Anesthesia Physical Anesthesia Plan  ASA: II  Anesthesia Plan: Epidural   Post-op Pain Management:    Induction:   Airway Management Planned:   Additional Equipment:   Intra-op Plan:   Post-operative Plan:   Informed Consent: I have reviewed the patients History and Physical, chart, labs and discussed the procedure including the risks, benefits and alternatives for the proposed anesthesia with the patient or authorized representative who has indicated his/her understanding and acceptance.     Plan Discussed with: Anesthesiologist  Anesthesia Plan Comments:         Anesthesia Quick Evaluation

## 2012-07-25 NOTE — Progress Notes (Signed)
Patient ID: Deborah Russell, female   DOB: 04-04-1991, 21 y.o.   MRN: [id number] Pitocin at 15 mu/ minute and the pt is contracting regularly but she hardly feels them. The cervix is 2+ cm 30 % effaced and the vertex is at -2/-3 station. AROM produced clear fluid

## 2012-07-25 NOTE — Anesthesia Procedure Notes (Signed)
Epidural Patient location during procedure: OB Start time: 07/25/2012 5:22 PM  Staffing Anesthesiologist: Xiana Carns A. Performed by: anesthesiologist   Preanesthetic Checklist Completed: patient identified, site marked, surgical consent, pre-op evaluation, timeout performed, IV checked, risks and benefits discussed and monitors and equipment checked  Epidural Patient position: sitting Prep: site prepped and draped and DuraPrep Patient monitoring: continuous pulse ox and blood pressure Approach: midline Injection technique: LOR air  Needle:  Needle type: Tuohy  Needle gauge: 17 G Needle length: 9 cm and 9 Needle insertion depth: 4 cm Catheter type: closed end flexible Catheter size: 19 Gauge Catheter at skin depth: 9 cm Test dose: negative and Other  Assessment Events: blood not aspirated, injection not painful, no injection resistance, negative IV test and no paresthesia  Additional Notes Patient identified. Risks and benefits discussed including failed block, incomplete  Pain control, post dural puncture headache, nerve damage, paralysis, blood pressure Changes, nausea, vomiting, reactions to medications-both toxic and allergic and post Partum back pain. All questions were answered. Patient expressed understanding and wished to proceed. Sterile technique was used throughout procedure. Epidural site was Dressed with sterile barrier dressing. No paresthesias, signs of intravascular injection Or signs of intrathecal spread were encountered.  Patient was more comfortable after the epidural was dosed. Please see RN's note for documentation of vital signs and FHR which are stable.

## 2012-07-26 ENCOUNTER — Encounter (HOSPITAL_COMMUNITY): Payer: Self-pay

## 2012-07-26 LAB — CBC
Hemoglobin: 10 g/dL — ABNORMAL LOW (ref 12.0–15.0)
MCHC: 32.9 g/dL (ref 30.0–36.0)
RBC: 3.91 MIL/uL (ref 3.87–5.11)
WBC: 12 10*3/uL — ABNORMAL HIGH (ref 4.0–10.5)

## 2012-07-26 MED ORDER — IBUPROFEN 600 MG PO TABS
600.0000 mg | ORAL_TABLET | Freq: Four times a day (QID) | ORAL | Status: DC
  Administered 2012-07-26 – 2012-07-27 (×6): 600 mg via ORAL
  Filled 2012-07-26 (×6): qty 1

## 2012-07-26 MED ORDER — DIBUCAINE 1 % RE OINT: 1.0000 "application " | TOPICAL_OINTMENT | RECTAL | Status: DC | PRN

## 2012-07-26 MED ORDER — LANOLIN HYDROUS EX OINT: TOPICAL_OINTMENT | CUTANEOUS | Status: DC | PRN

## 2012-07-26 MED ORDER — BENZOCAINE-MENTHOL 20-0.5 % EX AERO
1.0000 "application " | INHALATION_SPRAY | CUTANEOUS | Status: DC | PRN
  Filled 2012-07-26: qty 56

## 2012-07-26 MED ORDER — ONDANSETRON HCL 4 MG PO TABS: 4.0000 mg | ORAL_TABLET | ORAL | Status: DC | PRN

## 2012-07-26 MED ORDER — OXYCODONE-ACETAMINOPHEN 5-325 MG PO TABS: 1.0000 | ORAL_TABLET | ORAL | Status: DC | PRN

## 2012-07-26 MED ORDER — DIPHENHYDRAMINE HCL 25 MG PO CAPS: 25.0000 mg | ORAL_CAPSULE | Freq: Four times a day (QID) | ORAL | Status: DC | PRN

## 2012-07-26 MED ORDER — MEASLES, MUMPS & RUBELLA VAC ~~LOC~~ INJ
0.5000 mL | INJECTION | Freq: Once | SUBCUTANEOUS | Status: DC
  Filled 2012-07-26: qty 0.5

## 2012-07-26 MED ORDER — WITCH HAZEL-GLYCERIN EX PADS: 1.0000 "application " | MEDICATED_PAD | CUTANEOUS | Status: DC | PRN

## 2012-07-26 MED ORDER — PRENATAL MULTIVITAMIN CH: 1.0000 | ORAL_TABLET | Freq: Every day | ORAL | Status: DC

## 2012-07-26 MED ORDER — SENNOSIDES-DOCUSATE SODIUM 8.6-50 MG PO TABS: 2.0000 | ORAL_TABLET | Freq: Every day | ORAL | Status: DC

## 2012-07-26 MED ORDER — SIMETHICONE 80 MG PO CHEW: 80.0000 mg | CHEWABLE_TABLET | ORAL | Status: DC | PRN

## 2012-07-26 MED ORDER — PRENATAL MULTIVITAMIN CH
1.0000 | ORAL_TABLET | Freq: Every day | ORAL | Status: DC
  Administered 2012-07-26 – 2012-07-27 (×2): 1 via ORAL
  Filled 2012-07-26 (×2): qty 1

## 2012-07-26 MED ORDER — ONDANSETRON HCL 4 MG/2ML IJ SOLN: 4.0000 mg | INTRAMUSCULAR | Status: DC | PRN

## 2012-07-26 MED ORDER — TETANUS-DIPHTH-ACELL PERTUSSIS 5-2.5-18.5 LF-MCG/0.5 IM SUSP: 0.5000 mL | Freq: Once | INTRAMUSCULAR | Status: DC

## 2012-07-26 MED ORDER — LACTATED RINGERS IV SOLN: INTRAVENOUS | Status: AC

## 2012-07-26 MED ORDER — ZOLPIDEM TARTRATE 5 MG PO TABS
5.0000 mg | ORAL_TABLET | Freq: Every evening | ORAL | Status: DC | PRN
Start: 2012-07-26 — End: 2012-07-27

## 2012-07-26 MED ORDER — OXYTOCIN 40 UNITS IN LACTATED RINGERS INFUSION - SIMPLE MED: 62.5000 mL/h | INTRAVENOUS | Status: AC | PRN

## 2012-07-26 NOTE — Anesthesia Postprocedure Evaluation (Signed)
  Anesthesia Post-op Note  Patient: Deborah Russell  Procedure(s) Performed: * No procedures listed *  Patient Location: Mother/Baby  Anesthesia Type:Epidural  Level of Consciousness: awake, alert  and oriented  Airway and Oxygen Therapy: Patient Spontanous Breathing  Post-op Pain: mild  Post-op Assessment: Post-op Vital signs reviewed, Patient's Cardiovascular Status Stable, No headache, No backache, No residual numbness and No residual motor weakness  Post-op Vital Signs: Reviewed and stable  Complications: No apparent anesthesia complications

## 2012-07-26 NOTE — Progress Notes (Signed)
Patient ID: Deborah Russell, female   DOB: [birth year], 21 y.o.   MRN: [id number] #[id number] afebrile BP normal HGB stable

## 2012-07-26 NOTE — Clinical Social Work Note (Signed)
CSW attempted to see due to h/o [facility] admission for detox in 1/13. MOB had many visitors. CSW will attempt to see later today or in the am.   Doreen Salvage, LCSW  Coverning NICU for [facility]iding M-F 8am-12pm

## 2012-07-27 MED ORDER — IBUPROFEN 600 MG PO TABS: 600.0000 mg | ORAL_TABLET | Freq: Four times a day (QID) | ORAL | Status: DC

## 2012-07-27 MED ORDER — OXYCODONE-ACETAMINOPHEN 5-325 MG PO TABS: 1.0000 | ORAL_TABLET | Freq: Four times a day (QID) | ORAL | Status: DC | PRN

## 2012-07-27 NOTE — Progress Notes (Signed)
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 She admits to using marijuana prior to going to [facility]. Patient informed of hospital protocol to check baby for drugs and referral to CPS if found to be positive. Patient understands and plans to continue going to NA meetings and remaining clean. Full assessment to follow- Reece Levy, MSW, Theresia Majors [phone]

## 2012-07-27 NOTE — Discharge Summary (Signed)
Deborah Russell, Deborah Russell NO.:  [id number]  MEDICAL RECORD NO.:  [id number]  LOCATION:  9144                          FACILITY:  [facility]  PHYSICIAN:  Malachi Pro. Ambrose Mantle, M.D. DATE OF BIRTH:  1991-02-24  DATE OF ADMISSION:  07/25/2012 DATE OF DISCHARGE:  07/27/2012                              DISCHARGE SUMMARY   A 21 year old white female, para 0-0-1-0, gravida 2, [facility] July 26, 2012, admitted for induction of labor.  Blood group and type O positive. Negative antibody.  Pap smear normal.  Rubella immune.  RPR nonreactive. Urine culture negative.  Hepatitis B surface antigen negative, HIV negative, GC and Chlamydia negative.  First trimester screen, cystic fibrosis screen and AFP all negative.  One hour Glucola 99.  Group B strep negative.  After admission to the hospital, the patient was placed on Pitocin.  By 1:37 p.m., the Pitocin was at 15 milliunits a minute. The patient was contracting regularly but she hardly felt them.  The cervix was 2+ cm, 30% effaced, vertex at a -2 to -3 station.  Artificial rupture of the membranes produced clear fluid.  The patient later became uncomfortable.  She received an epidural.  By 6 p.m., the Pitocin was 20 milliunits a minute.  Contractions were sometimes close within 2 minutes.  She had an epidural and was comfortable.  The patient was thought to be 4 cm and thick, close to 6 p.m. by the RN but then she made very rapid progress to full dilatation.  She delivered spontaneously OA with an intact perineum by Dr. Ambrose Mantle, a living female infant with Apgars of 9 and 10 at 1 and 5 minutes.  The placenta delivered intact.  The uterus was normal.  There were bilateral labial lacerations.  The right was bleeding and was repaired and the left was hemostatic and not repaired.  Perineum was intact.  Blood loss about 400 mL.  Postpartum the patient did well, had no problems and was discharged on the second postpartum day.  Initial hemoglobin  11.2, hematocrit 34.1, white count 7100, platelet count 142,000, RPR nonreactive.  Followup hemoglobin was 10.7 and 10.0, platelet counts 136 and 146.  FINAL DIAGNOSES:  Intrauterine pregnancy at term, delivered occiput anterior.  OPERATION:  Spontaneous delivery OA, repair of labial laceration.  FINAL CONDITION:  Improved.  Instructions include our regular discharge instruction booklet.  The patient is advised to return to the office in 6 weeks for followup examination.  She is also given after visit summary and prescriptions for Motrin and Percocet 15 each, 1 every 6 hours as needed for pain even though the patient states that she does not want either, given her the prescriptions in case she needs.     Malachi Pro. Ambrose Mantle, M.D.     TFH/MEDQ  D:  07/27/2012  T:  07/27/2012  Job:  [id number]

## 2012-07-27 NOTE — Progress Notes (Signed)
Clinical Social Work Department PSYCHOSOCIAL ASSESSMENT - MATERNAL/CHILD 07/27/2012  Patient:  Deborah Russell, Deborah Russell  Account Number:  [id number]  Admit Date:  07/25/2012  Marjo Bicker Name:   Danna Hefty    Clinical Social Worker:  Andy Gauss   Date/Time:  07/27/2012 12:11 PM  Date Referred:  07/27/2012   Referral source  Physician     Referred reason  Substance Abuse   Other referral source:    I:  FAMILY / HOME ENVIRONMENT Child's legal guardian:  PARENT  Guardian - Name Guardian - Age Guardian - Address  Deborah Russell 21 [address] [place]  same   Other household support members/support persons Name Relationship DOB  Paternal GM    Paternal GF     Other support:   MOB's mother and other extended family    II  PSYCHOSOCIAL DATA Information Source:  Family Interview  Surveyor, quantity and [employer] Employment:   FOB- Teacher, English as a foreign language resources:  [employer] If [employer] - County:  [employer]  School / Grade:  12 Maternity Gaffer / Statistician / Early Interventions:  Cultural issues impacting care:    III  STRENGTHS Strengths  Home prepared for Child (including basic supplies)  Adequate Resources  Supportive family/friends   Strength comment:  Family has good family support and all needed supplies for baby-  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   IV  RISK FACTORS AND CURRENT PROBLEMS Current Problem:  YES   Risk Factor & Current Problem Patient Issue Family Issue Risk Factor / Current Problem Comment  Substance Abuse Y Y pending baby drug screen results   N N     V  SOCIAL WORK ASSESSMENT MOB aware and understands possible CPS referral/contact if  drug screen is positive.      VI SOCIAL WORK PLAN Social Work Plan  Other   Type of pt/family education:   If child protective services report - county:   If child protective services report - date:   Information/referral to community resources comment:   Other social work plan:   Will await baby drug screen for ?CPS referral.    Reece Levy, MSW, Theresia Majors [phone]

## 2012-07-27 NOTE — Progress Notes (Signed)
Patient ID: Deborah Russell, female   DOB: 1990-09-12, 21 y.o.   MRN: [id number] #[id number] afebrile BP normal no problems for d/c

## 2012-10-10 ENCOUNTER — Encounter (HOSPITAL_BASED_OUTPATIENT_CLINIC_OR_DEPARTMENT_OTHER): Payer: Self-pay

## 2012-10-10 ENCOUNTER — Emergency Department (HOSPITAL_BASED_OUTPATIENT_CLINIC_OR_DEPARTMENT_OTHER)
Admission: EM | Admit: 2012-10-10 | Discharge: 2012-10-10 | Disposition: A | Discharge status: Home / Self Care | Payer: Medicaid Other | Attending: Emergency Medicine | Admitting: Emergency Medicine

## 2012-10-10 DIAGNOSIS — R5381 Other malaise: Secondary | ICD-10-CM | POA: Insufficient documentation

## 2012-10-10 DIAGNOSIS — R1110 Vomiting, unspecified: Secondary | ICD-10-CM

## 2012-10-10 DIAGNOSIS — H539 Unspecified visual disturbance: Secondary | ICD-10-CM | POA: Insufficient documentation

## 2012-10-10 DIAGNOSIS — R42 Dizziness and giddiness: Secondary | ICD-10-CM | POA: Insufficient documentation

## 2012-10-10 DIAGNOSIS — Z8619 Personal history of other infectious and parasitic diseases: Secondary | ICD-10-CM | POA: Insufficient documentation

## 2012-10-10 DIAGNOSIS — E860 Dehydration: Secondary | ICD-10-CM

## 2012-10-10 DIAGNOSIS — R51 Headache: Secondary | ICD-10-CM | POA: Insufficient documentation

## 2012-10-10 DIAGNOSIS — R112 Nausea with vomiting, unspecified: Secondary | ICD-10-CM | POA: Insufficient documentation

## 2012-10-10 DIAGNOSIS — Z3202 Encounter for pregnancy test, result negative: Secondary | ICD-10-CM | POA: Insufficient documentation

## 2012-10-10 DIAGNOSIS — Z87891 Personal history of nicotine dependence: Secondary | ICD-10-CM | POA: Insufficient documentation

## 2012-10-10 DIAGNOSIS — Z8679 Personal history of other diseases of the circulatory system: Secondary | ICD-10-CM | POA: Insufficient documentation

## 2012-10-10 LAB — CBC WITH DIFFERENTIAL/PLATELET
Basophils Absolute: 0 10*3/uL (ref 0.0–0.1)
Basophils Relative: 0 % (ref 0–1)
Eosinophils Absolute: 0 10*3/uL (ref 0.0–0.7)
Eosinophils Relative: 1 % (ref 0–5)
Lymphocytes Relative: 26 % (ref 12–46)
MCV: 78.6 fL (ref 78.0–100.0)
Platelets: 166 10*3/uL (ref 150–400)
RDW: 14.4 % (ref 11.5–15.5)
WBC: 4.8 10*3/uL (ref 4.0–10.5)

## 2012-10-10 LAB — COMPREHENSIVE METABOLIC PANEL
ALT: 41 U/L — ABNORMAL HIGH (ref 0–35)
AST: 41 U/L — ABNORMAL HIGH (ref 0–37)
Albumin: 3.6 g/dL (ref 3.5–5.2)
CO2: 25 mEq/L (ref 19–32)
Calcium: 9.6 mg/dL (ref 8.4–10.5)
Sodium: 136 mEq/L (ref 135–145)
Total Protein: 7.1 g/dL (ref 6.0–8.3)

## 2012-10-10 LAB — URINALYSIS, ROUTINE W REFLEX MICROSCOPIC
Ketones, ur: 15 mg/dL — AB
Nitrite: NEGATIVE
pH: 5.5 (ref 5.0–8.0)

## 2012-10-10 LAB — URINE MICROSCOPIC-ADD ON

## 2012-10-10 LAB — PREGNANCY, URINE: Preg Test, Ur: NEGATIVE

## 2012-10-10 MED ORDER — SODIUM CHLORIDE 0.9 % IV BOLUS (SEPSIS)
1000.0000 mL | Freq: Once | INTRAVENOUS | Status: AC
  Administered 2012-10-10: 1000 mL via INTRAVENOUS

## 2012-10-10 MED ORDER — ONDANSETRON 4 MG PO TBDP: ORAL_TABLET | ORAL | Status: DC

## 2012-10-10 NOTE — ED Notes (Signed)
Pt reports nausea, vomiting and dizziness that started 2 weeks ago.

## 2012-10-10 NOTE — ED Notes (Signed)
MD at bedside. 

## 2012-10-10 NOTE — ED Provider Notes (Signed)
History     CSN: [id number]  Arrival date & time 10/10/12  1731   First MD Initiated Contact with Patient 10/10/12 1833      Chief Complaint  Patient presents with  . Nausea  . Dizziness  . Emesis    (Consider location/radiation/quality/duration/timing/severity/associated sxs/prior treatment) HPI Pt with ongoing HA's mostly in R parietal region similar to previous HA. States she has had increased lightheadedness with standing including blurred vision. States symptoms resolved when sitting down. Pt has had 4 days of vomiting and diarrhea with no fever or chills. No abd pain. No urinary or vaginal symptoms. No focal weakness or sensory changes Past Medical History  Diagnosis Date  . Migraine   . Hx of chlamydia infection     History reviewed. No pertinent past surgical history.  Family History  Problem Relation Age of Onset  . Hypertension Maternal Grandmother   . Cancer Paternal Grandfather     bladder, throat    History  Substance Use Topics  . Smoking status: Former Smoker -- 1.00 packs/day for 5 years    Types: Cigarettes  . Smokeless tobacco: Not on file  . Alcohol Use: No    OB History   Grav Para Term Preterm Abortions TAB SAB Ect Mult Living   2 1 1  1  1   1       Review of Systems  Constitutional: Positive for fatigue. Negative for fever and chills.  HENT: Negative for neck pain.   Eyes: Positive for visual disturbance. Negative for photophobia.  Respiratory: Negative for chest tightness and shortness of breath.   Cardiovascular: Negative for chest pain, palpitations and leg swelling.  Gastrointestinal: Positive for nausea and vomiting. Negative for abdominal pain, diarrhea, constipation and blood in stool.  Genitourinary: Negative for dysuria, frequency, hematuria, flank pain, vaginal bleeding, vaginal discharge and pelvic pain.  Musculoskeletal: Negative for myalgias and back pain.  Skin: Negative for rash and wound.  Neurological: Positive for  dizziness, light-headedness and headaches. Negative for tremors, syncope, speech difficulty, weakness and numbness.  All other systems reviewed and are negative.    Allergies  Review of patient's allergies indicates no known allergies.  Home Medications   Current Outpatient Rx  Name  Route  Sig  Dispense  Refill  . ibuprofen (ADVIL,MOTRIN) 600 MG tablet   Oral   Take 1 tablet (600 mg total) by mouth every 6 (six) hours.   15 tablet   0   . ondansetron (ZOFRAN ODT) 4 MG disintegrating tablet      4mg  ODT q4 hours prn nausea/vomit   10 tablet   0     BP 110/66  Pulse 95  Temp(Src) 97.8 F (36.6 C) (Oral)  Resp 16  Ht 5\' 5"  (1.651 m)  Wt 130 lb (58.968 kg)  BMI 21.63 kg/m2  SpO2 100%  Breastfeeding? No  Physical Exam  Nursing note and vitals reviewed. Constitutional: She is oriented to person, place, and time. She appears well-developed and well-nourished. No distress.  HENT:  Head: Normocephalic and atraumatic.  Mouth/Throat: Oropharynx is clear and moist.  Eyes: EOM are normal. Pupils are equal, round, and reactive to light.  Neck: Normal range of motion. Neck supple.  Cardiovascular: Normal rate and regular rhythm.   Pulmonary/Chest: Effort normal and breath sounds normal. No respiratory distress. She has no wheezes. She has no rales. She exhibits no tenderness.  Abdominal: Soft. Bowel sounds are normal. She exhibits no distension and no mass. There is no tenderness. There  is no rebound and no guarding.  Musculoskeletal: Normal range of motion. She exhibits no edema and no tenderness.  Neurological: She is alert and oriented to person, place, and time.  5/5 motor in all ext, sensation intact, CN II-XII intact.   Skin: Skin is warm and dry. No rash noted. No erythema.  Psychiatric: She has a normal mood and affect. Her behavior is normal.    ED Course  Procedures (including critical care time)  Labs Reviewed  URINALYSIS, ROUTINE W REFLEX MICROSCOPIC -  Abnormal; Notable for the following:    Ketones, ur 15 (*)    Leukocytes, UA MODERATE (*)    All other components within normal limits  COMPREHENSIVE METABOLIC PANEL - Abnormal; Notable for the following:    Glucose, Bld 106 (*)    AST 41 (*)    ALT 41 (*)    All other components within normal limits  URINE MICROSCOPIC-ADD ON - Abnormal; Notable for the following:    Bacteria, UA FEW (*)    All other components within normal limits  URINE CULTURE  PREGNANCY, URINE  CBC WITH DIFFERENTIAL  LIPASE, BLOOD   No results found.   1. Dehydration   2. Vomiting       MDM   Pt states she is feeling much better after IVF's. Advised to drink plenty of fluids and return for worsening symptoms or any concerns       Loren Racer, MD 10/11/12 1531

## 2012-10-11 LAB — URINE CULTURE

## 2013-03-20 ENCOUNTER — Emergency Department: Payer: Self-pay | Admitting: Emergency Medicine

## 2013-03-20 ENCOUNTER — Telehealth (HOSPITAL_COMMUNITY): Payer: Self-pay | Admitting: Licensed Clinical Social Worker

## 2013-03-20 LAB — COMPREHENSIVE METABOLIC PANEL
Albumin: 4 g/dL (ref 3.4–5.0)
Anion Gap: 4 — ABNORMAL LOW (ref 7–16)
EGFR (African American): >60
Glucose: 105 mg/dL — ABNORMAL HIGH (ref 65–99)
Osmolality: 279 (ref 275–301)
Potassium: 4 mmol/L (ref 3.5–5.1)
SGOT(AST): 24 U/L (ref 15–37)
SGPT (ALT): 21 U/L (ref 12–78)
Total Protein: 7.9 g/dL (ref 6.4–8.2)

## 2013-03-20 LAB — CBC
HCT: 41.5 % (ref 35.0–47.0)
MCV: 83 fL (ref 80–100)
Platelet: 212 10*3/uL (ref 150–440)
RBC: 4.99 10*6/uL (ref 3.80–5.20)
RDW: 12.7 % (ref 11.5–14.5)
WBC: 6.2 10*3/uL (ref 3.6–11.0)

## 2013-03-20 LAB — ETHANOL: Ethanol %: <0.003 % (ref 0.000–0.080)

## 2013-03-20 LAB — URINALYSIS, COMPLETE
Bilirubin,UR: NEGATIVE
Glucose,UR: NEGATIVE mg/dL (ref 0–75)
Leukocyte Esterase: NEGATIVE
Nitrite: NEGATIVE
Ph: 6 (ref 4.5–8.0)
Protein: NEGATIVE
RBC,UR: 1 /HPF (ref 0–5)
Specific Gravity: 1.01 (ref 1.003–1.030)
WBC UR: 2 /HPF (ref 0–5)

## 2013-03-20 LAB — DRUG SCREEN, URINE
Amphetamines, Ur Screen: NEGATIVE (ref ?–1000)
MDMA (Ecstasy)Ur Screen: NEGATIVE (ref ?–500)
Opiate, Ur Screen: POSITIVE (ref ?–300)
Phencyclidine (PCP) Ur S: NEGATIVE (ref ?–25)
Tricyclic, Ur Screen: NEGATIVE (ref ?–1000)

## 2013-03-20 LAB — TSH: Thyroid Stimulating Horm: 0.87 u[IU]/mL

## 2013-03-20 NOTE — BH Assessment (Signed)
Patient called to the assessment department stating that she was interested in opioid detox. Sts that she is using Heroin only.Ecologist made patient aware that opiate detox is not offered here at [facility] unless it's a dual diagnosis. Patient denies SI and depressive symptoms. Patient has MCD and was referred to [facility]. Writer also informed patient that [facility] requires medical clearance and authorization. Writer encouraged patient to present to a ER department [facility], [facility], or [facility].

## 2013-06-23 ENCOUNTER — Encounter (HOSPITAL_BASED_OUTPATIENT_CLINIC_OR_DEPARTMENT_OTHER): Payer: Self-pay | Admitting: Emergency Medicine

## 2013-06-23 ENCOUNTER — Emergency Department (HOSPITAL_BASED_OUTPATIENT_CLINIC_OR_DEPARTMENT_OTHER)
Admission: EM | Admit: 2013-06-23 | Discharge: 2013-06-23 | Disposition: A | Discharge status: Home / Self Care | Payer: Medicaid Other | Attending: Emergency Medicine | Admitting: Emergency Medicine

## 2013-06-23 DIAGNOSIS — Z8679 Personal history of other diseases of the circulatory system: Secondary | ICD-10-CM | POA: Insufficient documentation

## 2013-06-23 DIAGNOSIS — Z8619 Personal history of other infectious and parasitic diseases: Secondary | ICD-10-CM | POA: Insufficient documentation

## 2013-06-23 DIAGNOSIS — O200 Threatened abortion: Secondary | ICD-10-CM | POA: Insufficient documentation

## 2013-06-23 DIAGNOSIS — R109 Unspecified abdominal pain: Secondary | ICD-10-CM | POA: Insufficient documentation

## 2013-06-23 DIAGNOSIS — O9989 Other specified diseases and conditions complicating pregnancy, childbirth and the puerperium: Secondary | ICD-10-CM | POA: Insufficient documentation

## 2013-06-23 DIAGNOSIS — O99330 Smoking (tobacco) complicating pregnancy, unspecified trimester: Secondary | ICD-10-CM | POA: Insufficient documentation

## 2013-06-23 LAB — URINE MICROSCOPIC-ADD ON

## 2013-06-23 LAB — CBC
MCV: 85.5 fL (ref 78.0–100.0)
Platelets: 206 10*3/uL (ref 150–400)
RBC: 4.63 MIL/uL (ref 3.87–5.11)
WBC: 8.2 10*3/uL (ref 4.0–10.5)

## 2013-06-23 LAB — URINALYSIS, ROUTINE W REFLEX MICROSCOPIC
Bilirubin Urine: NEGATIVE
Glucose, UA: NEGATIVE mg/dL
Ketones, ur: NEGATIVE mg/dL
Leukocytes, UA: NEGATIVE
pH: 6.5 (ref 5.0–8.0)

## 2013-06-23 LAB — WET PREP, GENITAL: Yeast Wet Prep HPF POC: NONE SEEN

## 2013-06-23 LAB — HCG, QUANTITATIVE, PREGNANCY: hCG, Beta Chain, Quant, S: 8593 m[IU]/mL — ABNORMAL HIGH (ref <5)

## 2013-06-23 NOTE — ED Provider Notes (Signed)
  Medical screening examination/treatment/procedure(s) were performed by non-physician practitioner and as supervising physician I was immediately available for consultation/collaboration.  EKG Interpretation   None          Jonet Mathies, MD 06/23/13 2149 

## 2013-06-23 NOTE — ED Provider Notes (Signed)
CSN: [id number]     Arrival date & time 06/23/13  1831 History   First MD Initiated Contact with Patient 06/23/13 1840     Chief Complaint  Patient presents with  . Vaginal Bleeding   (Consider location/radiation/quality/duration/timing/severity/associated sxs/prior Treatment) Patient is a 22 y.o. female presenting with vaginal bleeding. The history is provided by the patient. No language interpreter was used.  Vaginal Bleeding Quality:  Bright red Severity:  Moderate Onset quality:  Sudden Associated symptoms: abdominal pain   Associated symptoms: no dysuria, no fever, no nausea and no vaginal discharge   Associated symptoms comment:  Vaginal bleeding that started tonight. She reports being approximately [redacted] weeks pregnant without prenatal care as of yet. She has had some lower abdominal cramping for several weeks that is unchanged today. No dysuria, vaginal discharge other than bleeding or dyspareunia.    Past Medical History  Diagnosis Date  . Migraine   . Hx of chlamydia infection    No past surgical history on file. Family History  Problem Relation Age of Onset  . Hypertension Maternal Grandmother   . Cancer Paternal Grandfather     bladder, throat   History  Substance Use Topics  . Smoking status: Current Every Day Smoker -- 1.00 packs/day for 5 years    Types: Cigarettes  . Smokeless tobacco: Not on file  . Alcohol Use: No   OB History   Grav Para Term Preterm Abortions TAB SAB Ect Mult Living   3 1 1  1  1   1      Review of Systems  Constitutional: Negative for fever.  Respiratory: Negative for shortness of breath.   Cardiovascular: Negative for chest pain.  Gastrointestinal: Positive for abdominal pain. Negative for nausea and vomiting.  Genitourinary: Positive for vaginal bleeding. Negative for dysuria and vaginal discharge.  Neurological: Negative for light-headedness.    Allergies  Review of patient's allergies indicates no known allergies.  Home  Medications  No current outpatient prescriptions on file. LMP 05/01/2013 Physical Exam  Constitutional: She is oriented to person, place, and time. She appears well-developed and well-nourished.  HENT:  Head: Normocephalic.  Neck: Normal range of motion. Neck supple.  Pulmonary/Chest: Effort normal.  Abdominal: Soft. Bowel sounds are normal. There is no tenderness. There is no rebound and no guarding.  Genitourinary:  Moderate cervical bleeding without visualized POC or purulent discharge. Os is closed. No uterine or adnexal mass or tenderness.   Musculoskeletal: Normal range of motion.  Neurological: She is alert and oriented to person, place, and time.  Skin: Skin is warm and dry. No rash noted.  Psychiatric: She has a normal mood and affect.    ED Course  Procedures (including critical care time) Labs Review Labs Reviewed  PREGNANCY, URINE - Abnormal; Notable for the following:    Preg Test, Ur POSITIVE (*)    All other components within normal limits  URINALYSIS, ROUTINE W REFLEX MICROSCOPIC   Results for orders placed during the hospital encounter of 06/23/13  WET PREP, GENITAL      Result Value Range   Yeast Wet Prep HPF POC NONE SEEN  NONE SEEN   Trich, Wet Prep NONE SEEN  NONE SEEN   Clue Cells Wet Prep HPF POC MODERATE (*) NONE SEEN   WBC, Wet Prep HPF POC TOO NUMEROUS TO COUNT (*) NONE SEEN  URINALYSIS, ROUTINE W REFLEX MICROSCOPIC      Result Value Range   Color, Urine YELLOW  YELLOW   APPearance CLEAR  CLEAR   Specific Gravity, Urine 1.023  1.005 - 1.030   pH 6.5  5.0 - 8.0   Glucose, UA NEGATIVE  NEGATIVE mg/dL   Hgb urine dipstick SMALL (*) NEGATIVE   Bilirubin Urine NEGATIVE  NEGATIVE   Ketones, ur NEGATIVE  NEGATIVE mg/dL   Protein, ur NEGATIVE  NEGATIVE mg/dL   Urobilinogen, UA 0.2  0.0 - 1.0 mg/dL   Nitrite NEGATIVE  NEGATIVE   Leukocytes, UA NEGATIVE  NEGATIVE  PREGNANCY, URINE      Result Value Range   Preg Test, Ur POSITIVE (*) NEGATIVE  URINE  MICROSCOPIC-ADD ON      Result Value Range   Squamous Epithelial / LPF RARE  RARE   WBC, UA 0-2  <3 WBC/hpf   RBC / HPF 3-6  <3 RBC/hpf   Bacteria, UA RARE  RARE   Urine-Other MUCOUS PRESENT    HCG, QUANTITATIVE, PREGNANCY      Result Value Range   hCG, Beta Chain, Quant, S 8593 (*) <5 mIU/mL  CBC      Result Value Range   WBC 8.2  4.0 - 10.5 K/uL   RBC 4.63  3.87 - 5.11 MIL/uL   Hemoglobin 13.4  12.0 - 15.0 g/dL   HCT 16.1  09.6 - 04.5 %   MCV 85.5  78.0 - 100.0 fL   MCH 28.9  26.0 - 34.0 pg   MCHC 33.8  30.0 - 36.0 g/dL   RDW 40.9  81.1 - 91.4 %   Platelets 206  150 - 400 K/uL    Imaging Review No results found.  EKG Interpretation   None       MDM  No diagnosis found. 1. Threatened miscarriage  She appears stable, VSS. Hbg 13.4. She is non-tender on exam. Will discharge home and send to [facility] for recheck and further evaluation, including possible ultrasound, in 24 hours.     Arnoldo Hooker, PA-C 06/23/13 2054

## 2013-06-23 NOTE — ED Notes (Signed)
Pt started with vaginal bleeding tonight.  Pt thinks she is approximately [redacted] weeks pregnant.  Pt wiped and saw several blood clots.  Some abdominal cramping.

## 2013-06-24 ENCOUNTER — Inpatient Hospital Stay (HOSPITAL_COMMUNITY): Payer: Medicaid Other

## 2013-06-24 ENCOUNTER — Encounter (HOSPITAL_COMMUNITY): Payer: Self-pay

## 2013-06-24 ENCOUNTER — Inpatient Hospital Stay (HOSPITAL_COMMUNITY)
Admission: AD | Admit: 2013-06-24 | Discharge: 2013-06-24 | Disposition: A | Discharge status: Home / Self Care | Payer: Medicaid Other | Source: Ambulatory Visit | Attending: Obstetrics and Gynecology | Admitting: Obstetrics and Gynecology

## 2013-06-24 DIAGNOSIS — O200 Threatened abortion: Secondary | ICD-10-CM | POA: Insufficient documentation

## 2013-06-24 DIAGNOSIS — O209 Hemorrhage in early pregnancy, unspecified: Secondary | ICD-10-CM

## 2013-06-24 DIAGNOSIS — R109 Unspecified abdominal pain: Secondary | ICD-10-CM | POA: Insufficient documentation

## 2013-06-24 LAB — CBC
HCT: 38.3 % (ref 36.0–46.0)
Hemoglobin: 13.2 g/dL (ref 12.0–15.0)
MCHC: 34.5 g/dL (ref 30.0–36.0)
RBC: 4.6 MIL/uL (ref 3.87–5.11)

## 2013-06-24 LAB — HCG, QUANTITATIVE, PREGNANCY: hCG, Beta Chain, Quant, S: 8563 m[IU]/mL — ABNORMAL HIGH (ref <5)

## 2013-06-24 LAB — GC/CHLAMYDIA PROBE AMP: CT Probe RNA: NEGATIVE

## 2013-06-24 NOTE — MAU Note (Signed)
Patient is in with c/o continued vaginal bleeding. She states that she was seen yesterday at the ED and was told to f/u here if bleeding continues for an ultrasound. She states that she wants to go to [facility] office but is awaiting a callback. The pantiliner she came in with is moderately saturated. New pad given. She c/o lower abdominal cramping.

## 2013-06-24 NOTE — MAU Provider Note (Signed)
History     CSN: [id number]  Arrival date and time: 06/24/13 1424   First Provider Initiated Contact with Patient 06/24/13 1456      No chief complaint on file.  HPI  Ms. Deborah Russell is a 22 y.o. female G3P1011 at [redacted]w[redacted]d who presents with complaints for vaginal bleeding and lower abdominal cramping. The symptoms started yesterday; she went to [facility] at that time. They did blood work, however did not do an [place]. She plans to see Dr. Ebony Hail office, however does not have a scheduled appointment. Since yesterday the bleeding has remained about the same. Last night the bleeding had just about stopped, however today while she was at work she noticed an increase amount.   OB History   Grav Para Term Preterm Abortions TAB SAB Ect Mult Living   3 1 1  1  1   1       Past Medical History  Diagnosis Date  . Migraine   . Hx of chlamydia infection     History reviewed. No pertinent past surgical history.  Family History  Problem Relation Age of Onset  . Hypertension Maternal Grandmother   . Cancer Paternal Grandfather     bladder, throat    History  Substance Use Topics  . Smoking status: Current Every Day Smoker -- 1.00 packs/day for 5 years    Types: Cigarettes  . Smokeless tobacco: Not on file  . Alcohol Use: No    Allergies: No Known Allergies  No prescriptions prior to admission    Results for orders placed during the hospital encounter of 06/24/13 (from the past 24 hour(s))  HCG, QUANTITATIVE, PREGNANCY     Status: Abnormal   Collection Time    06/24/13  3:09 PM      Result Value Range   hCG, Beta Chain, Quant, S 8563 (*) <5 mIU/mL  CBC     Status: None   Collection Time    06/24/13  3:10 PM      Result Value Range   WBC 7.6  4.0 - 10.5 K/uL   RBC 4.60  3.87 - 5.11 MIL/uL   Hemoglobin 13.2  12.0 - 15.0 g/dL   HCT 40.9  81.1 - 91.4 %   MCV 83.3  78.0 - 100.0 fL   MCH 28.7  26.0 - 34.0 pg   MCHC 34.5  30.0 - 36.0 g/dL   RDW 78.2  95.6 - 21.3 %    Platelets 201  150 - 400 K/uL   US Ob Comp Less 14 Wks  06/24/2013   CLINICAL DATA:  Pregnant, bleeding  EXAM: OBSTETRIC <14 WK [place] AND TRANSVAGINAL OB US  TECHNIQUE: Both transabdominal and transvaginal ultrasound examinations were performed for complete evaluation of the gestation as well as the maternal uterus, adnexal regions, and pelvic cul-de-sac. Transvaginal technique was performed to assess early pregnancy.  COMPARISON:  None.  FINDINGS: Intrauterine gestational sac: Visualized/normal in shape.  Yolk sac:  Present  Embryo:  Present  Cardiac Activity: Not visualized  CRL:   5.1  mm   6 w 2 d                  [place] EDC: 02/15/2014  Maternal uterus/adnexae: No serve chronic hemorrhage.  Left ovary is within normal limits, measuring 1.1 x 2.4 x 1.3 cm.  Right ovary measures 2.1 by 2.7 x 2.2 cm and is notable for a 1.2 cm corpus luteal cyst.  No free fluid.  IMPRESSION: Single  intrauterine gestation with estimated gestational age [redacted] weeks 2 days by crown-rump length.  No cardiac activity is demonstrated. Findings are suspicious but not yet definitive for failed pregnancy given crown-rump length less than 7 mm.  Recommend follow-up US in 10-14 days for definitive diagnosis. This recommendation follows SRU consensus guidelines: Diagnostic Criteria for Nonviable Pregnancy Early in the First Trimester. Malva Limes Med 2013; 409:8119-14.   Electronically Signed   By: Charline Bills M.D.   On: 06/24/2013 16:58   US Ob Transvaginal  06/24/2013   CLINICAL DATA:  Pregnant, bleeding  EXAM: OBSTETRIC <14 WK [place] AND TRANSVAGINAL OB US  TECHNIQUE: Both transabdominal and transvaginal ultrasound examinations were performed for complete evaluation of the gestation as well as the maternal uterus, adnexal regions, and pelvic cul-de-sac. Transvaginal technique was performed to assess early pregnancy.  COMPARISON:  None.  FINDINGS: Intrauterine gestational sac: Visualized/normal in shape.  Yolk sac:  Present  Embryo:  Present   Cardiac Activity: Not visualized  CRL:   5.1  mm   6 w 2 d                  [place] EDC: 02/15/2014  Maternal uterus/adnexae: No serve chronic hemorrhage.  Left ovary is within normal limits, measuring 1.1 x 2.4 x 1.3 cm.  Right ovary measures 2.1 by 2.7 x 2.2 cm and is notable for a 1.2 cm corpus luteal cyst.  No free fluid.  IMPRESSION: Single intrauterine gestation with estimated gestational age [redacted] weeks 2 days by crown-rump length.  No cardiac activity is demonstrated. Findings are suspicious but not yet definitive for failed pregnancy given crown-rump length less than 7 mm.  Recommend follow-up US in 10-14 days for definitive diagnosis. This recommendation follows SRU consensus guidelines: Diagnostic Criteria for Nonviable Pregnancy Early in the First Trimester. Malva Limes Med 2013; 782:9562-13.   Electronically Signed   By: Charline Bills M.D.   On: 06/24/2013 16:58    Review of Systems  Constitutional: Negative for fever and chills.  Gastrointestinal: Positive for abdominal pain. Negative for nausea, vomiting, diarrhea and constipation.  Genitourinary: Negative for dysuria, urgency and frequency.       No vaginal discharge. + vaginal bleeding. No dysuria.   Musculoskeletal: Negative for back pain.   Physical Exam   Blood pressure 117/60, pulse 77, temperature 97.5 F (36.4 C), temperature source Oral, resp. rate 16, height 5\' 5"  (1.651 m), weight 56.813 kg (125 lb 4 oz), last menstrual period 05/01/2013.  Physical Exam  Constitutional: She is oriented to person, place, and time. She appears well-developed and well-nourished. No distress.  HENT:  Head: Normocephalic.  Eyes: Pupils are equal, round, and reactive to light.  Neck: Neck supple.  Respiratory: Effort normal.  Genitourinary: Vaginal discharge found.  Speculum exam: Vagina - Small amount of bright red bleeding in vault, no odor Cervix - moderate amount of thick, brown, mucus like discharge at cervical os.  Bimanual exam:  deferred  Chaperone present for exam.   Musculoskeletal: Normal range of motion.  Neurological: She is alert and oriented to person, place, and time.  Skin: Skin is warm. She is not diaphoretic.  Psychiatric: Her behavior is normal.    MAU Course  Procedures None  MDM O positive blood type [place] Beta Hcg CBC   Consulted with Dr.Bovard, discussed plan of care.  Assessment and Plan   A: Single IUP 6 w 2 d; no cardiac activity  Vaginal bleeding in early pregnancy Threatened AB  P: Discharge home Return in 48 hours for repeat Beta hcg level Pt may need repeat US in 7 days based on the beta hcg level numbers done on 11/19- will call the PCP and discuss following beta follow up Bleeding precautions discussed Pelvic rest  Support given   [facility], Qiana Landgrebe IRENE NP  06/24/2013, 7:29 PM

## 2013-06-25 ENCOUNTER — Telehealth: Payer: Self-pay | Admitting: Obstetrics and Gynecology

## 2013-06-25 DIAGNOSIS — Z3201 Encounter for pregnancy test, result positive: Secondary | ICD-10-CM

## 2013-06-25 MED ORDER — METRONIDAZOLE 500 MG PO TABS: 500.0000 mg | ORAL_TABLET | Freq: Two times a day (BID) | ORAL | Status: DC

## 2013-07-08 ENCOUNTER — Ambulatory Visit (HOSPITAL_COMMUNITY): Payer: Medicaid Other | Attending: Obstetrics and Gynecology

## 2013-12-17 ENCOUNTER — Encounter (HOSPITAL_BASED_OUTPATIENT_CLINIC_OR_DEPARTMENT_OTHER): Payer: Self-pay | Admitting: Emergency Medicine

## 2013-12-17 ENCOUNTER — Emergency Department (HOSPITAL_BASED_OUTPATIENT_CLINIC_OR_DEPARTMENT_OTHER)
Admission: EM | Admit: 2013-12-17 | Discharge: 2013-12-17 | Disposition: A | Discharge status: Home / Self Care | Payer: Medicaid Other | Attending: Emergency Medicine | Admitting: Emergency Medicine

## 2013-12-17 DIAGNOSIS — F17200 Nicotine dependence, unspecified, uncomplicated: Secondary | ICD-10-CM | POA: Insufficient documentation

## 2013-12-17 DIAGNOSIS — Z792 Long term (current) use of antibiotics: Secondary | ICD-10-CM | POA: Insufficient documentation

## 2013-12-17 DIAGNOSIS — K0889 Other specified disorders of teeth and supporting structures: Secondary | ICD-10-CM

## 2013-12-17 DIAGNOSIS — G43909 Migraine, unspecified, not intractable, without status migrainosus: Secondary | ICD-10-CM | POA: Insufficient documentation

## 2013-12-17 DIAGNOSIS — K089 Disorder of teeth and supporting structures, unspecified: Secondary | ICD-10-CM | POA: Insufficient documentation

## 2013-12-17 DIAGNOSIS — K0381 Cracked tooth: Secondary | ICD-10-CM | POA: Insufficient documentation

## 2013-12-17 DIAGNOSIS — Z8619 Personal history of other infectious and parasitic diseases: Secondary | ICD-10-CM | POA: Insufficient documentation

## 2013-12-17 MED ORDER — AMOXICILLIN 500 MG PO CAPS: 500.0000 mg | ORAL_CAPSULE | Freq: Three times a day (TID) | ORAL | Status: AC

## 2013-12-17 MED ORDER — DICLOFENAC SODIUM 50 MG PO TBEC: 50.0000 mg | DELAYED_RELEASE_TABLET | Freq: Two times a day (BID) | ORAL | Status: DC

## 2013-12-17 NOTE — ED Provider Notes (Signed)
CSN: [id number]     Arrival date & time 12/17/13  1445 History   First MD Initiated Contact with Patient 12/17/13 1459     Chief Complaint  Patient presents with  . Dental Pain     (Consider location/radiation/quality/duration/timing/severity/associated sxs/prior Treatment) Patient is a 23 y.o. female presenting with tooth pain. The history is provided by the patient. No language interpreter was used.  Dental Pain Location:  Upper Upper teeth location:  1/RU 3rd molar Quality:  Dull Severity:  Moderate Progression:  Worsening Chronicity:  New Relieved by:  Nothing Risk factors: lack of dental care     Past Medical History  Diagnosis Date  . Migraine   . Hx of chlamydia infection    History reviewed. No pertinent past surgical history. Family History  Problem Relation Age of Onset  . Hypertension Maternal Grandmother   . Cancer Paternal Grandfather     bladder, throat   History  Substance Use Topics  . Smoking status: Current Every Day Smoker -- 1.00 packs/day for 5 years    Types: Cigarettes  . Smokeless tobacco: Not on file  . Alcohol Use: No   OB History   Grav Para Term Preterm Abortions TAB SAB Ect Mult Living   3 1 1  1  1   1      Review of Systems  HENT: Positive for dental problem.   All other systems reviewed and are negative.     Allergies  Review of patient's allergies indicates no known allergies.  Home Medications   Prior to Admission medications   Medication Sig Start Date End Date Taking? Authorizing Provider  amoxicillin (AMOXIL) 500 MG capsule Take 1 capsule (500 mg total) by mouth 3 (three) times daily. 12/17/13 12/27/13  Elson AreasLeslie K Edana Aguado, PA-C  diclofenac (VOLTAREN) 50 MG EC tablet Take 1 tablet (50 mg total) by mouth 2 (two) times daily. 12/17/13   Elson AreasLeslie K Maddax Palinkas, PA-C  metroNIDAZOLE (FLAGYL) 500 MG tablet Take 1 tablet (500 mg total) by mouth 2 (two) times daily. 06/25/13   Debbrah AlarJennifer Irene Rasch, NP   BP 110/67  Pulse 101  Temp(Src) 98.9  F (37.2 C) (Oral)  Resp 16  Ht 5\' 5"  (1.651 m)  Wt 125 lb (56.7 kg)  BMI 20.80 kg/m2  SpO2 100%  LMP 12/08/2013  Breastfeeding? Unknown Physical Exam  Nursing note and vitals reviewed. Constitutional: She is oriented to person, place, and time. She appears well-developed and well-nourished.  HENT:  Head: Normocephalic.  Broken third molar  Eyes: EOM are normal. Pupils are equal, round, and reactive to light.  Neck: Normal range of motion.  Pulmonary/Chest: Effort normal.  Musculoskeletal: Normal range of motion.  Neurological: She is alert and oriented to person, place, and time.  Psychiatric: She has a normal mood and affect.    ED Course  Procedures (including critical care time) Labs Review Labs Reviewed - No data to display  Imaging Review No results found.   EKG Interpretation None      MDM   Final diagnoses:  Toothache    voltaren amoxicillian knox    Elson AreasLeslie K Merci Walthers, PA-C 12/17/13 1545  Lonia SkinnerLeslie K San RafaelSofia, [place] JerseyPA-C 12/17/13 1550

## 2013-12-17 NOTE — ED Notes (Signed)
Pt reports 3 weeks ago chipped upper right wisdom tooth.  Since this time with worsening pain, no swelling noted.

## 2013-12-17 NOTE — ED Notes (Signed)
PA at bedside.

## 2013-12-17 NOTE — Discharge Instructions (Signed)

## 2013-12-18 NOTE — ED Provider Notes (Signed)
History/physical exam/procedure(s) were performed by non-physician practitioner and as supervising physician I was immediately available for consultation/collaboration. I have reviewed all notes and am in agreement with care and plan.   Hilario Quarryanielle S Sunday Klos, MD 12/18/13 [phone]

## 2014-01-28 ENCOUNTER — Encounter (HOSPITAL_COMMUNITY): Payer: Self-pay

## 2014-04-29 ENCOUNTER — Encounter (HOSPITAL_COMMUNITY): Payer: Self-pay | Admitting: *Deleted

## 2014-06-09 ENCOUNTER — Encounter (HOSPITAL_COMMUNITY): Payer: Self-pay | Admitting: *Deleted

## 2014-08-29 IMAGING — US US OB COMP LESS 14 WK
1 series · 13 of 28 positions shown · non-contrast
Comparison: None.

CLINICAL DATA: Pregnant, bleeding

EXAM:
OBSTETRIC <14 WK US AND TRANSVAGINAL OB US
TECHNIQUE: Both transabdominal and transvaginal ultrasound examinations were
performed for complete evaluation of the gestation as well as the
maternal uterus, adnexal regions, and pelvic cul-de-sac.
Transvaginal technique was performed to assess early pregnancy.

[Series 1: us ob comp less 14 wks · 13 of 53 slices shown]
[im 2/53]
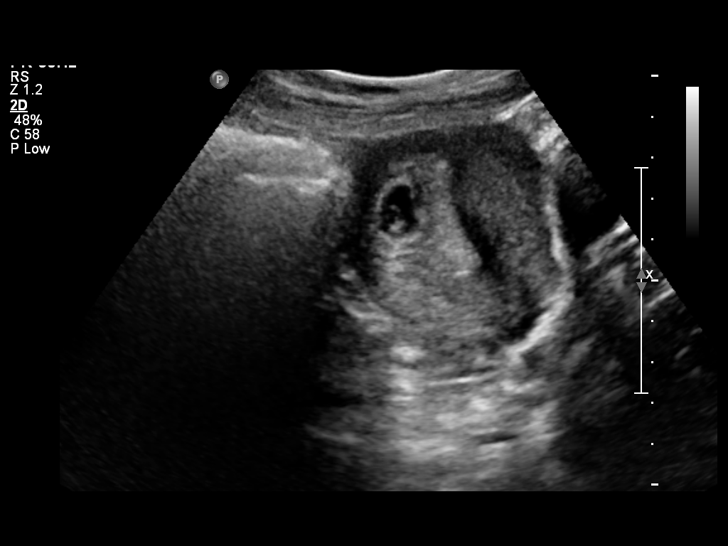
[im 6/53]
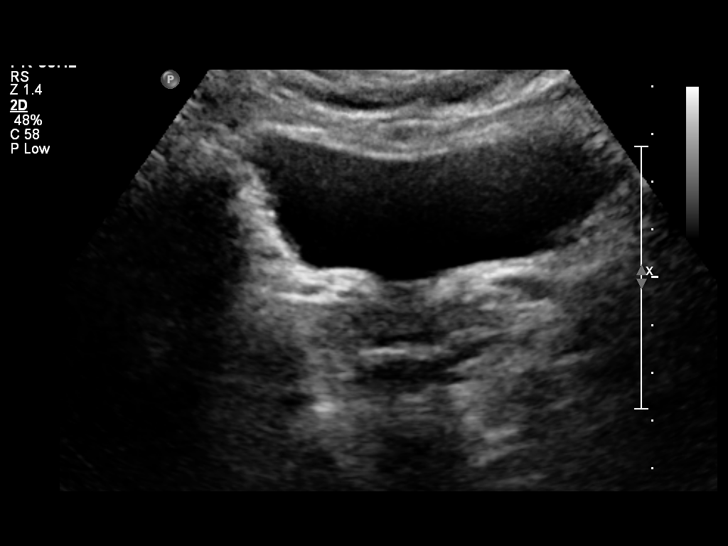
[im 10/53]
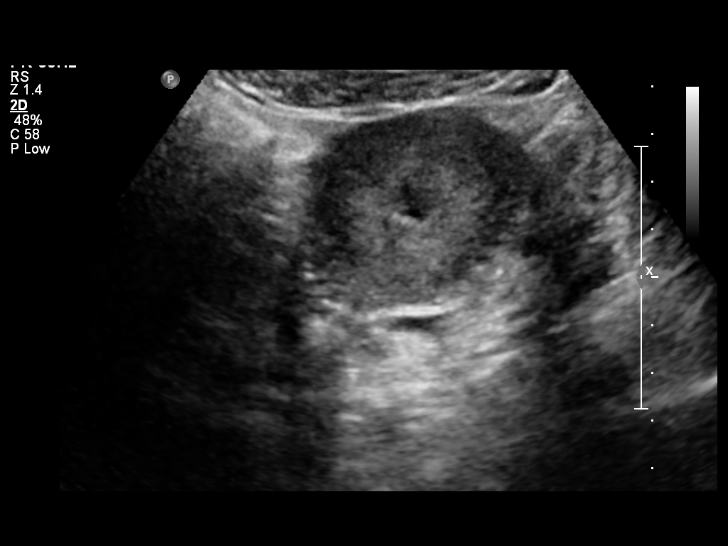
[im 14/53]
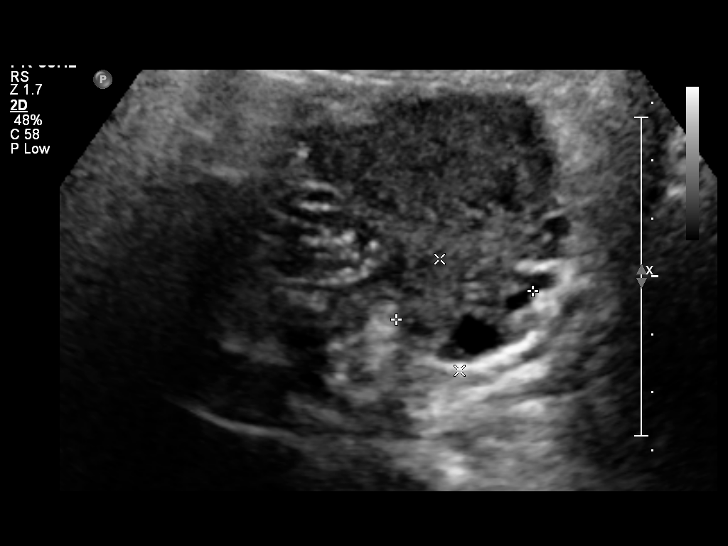
[im 18/53]
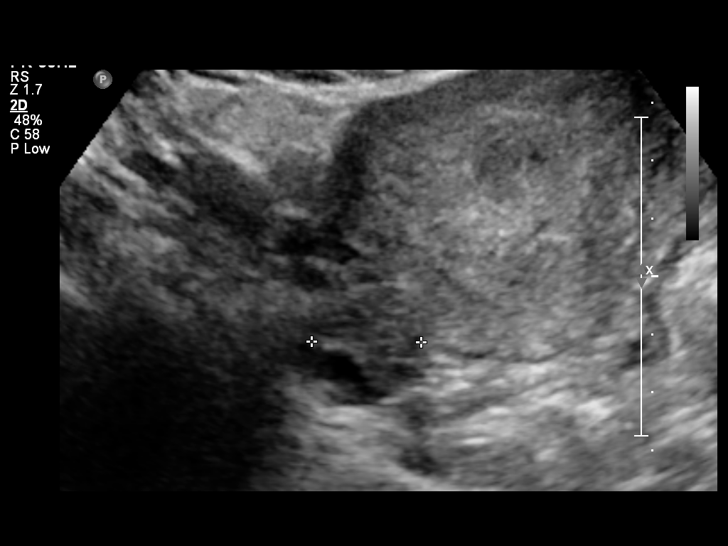
[im 22/53]
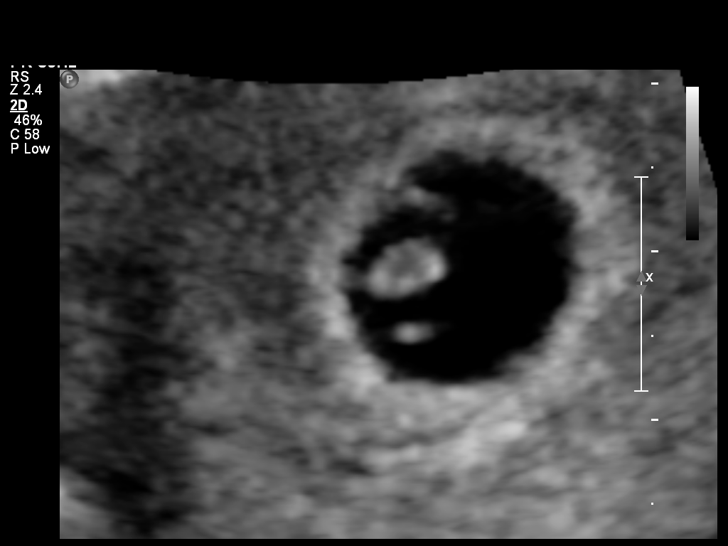
[im 27/53]
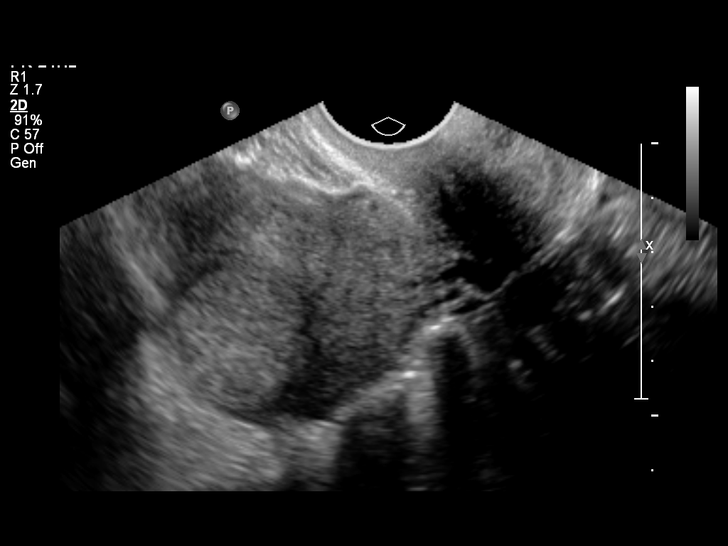
[im 31/53]
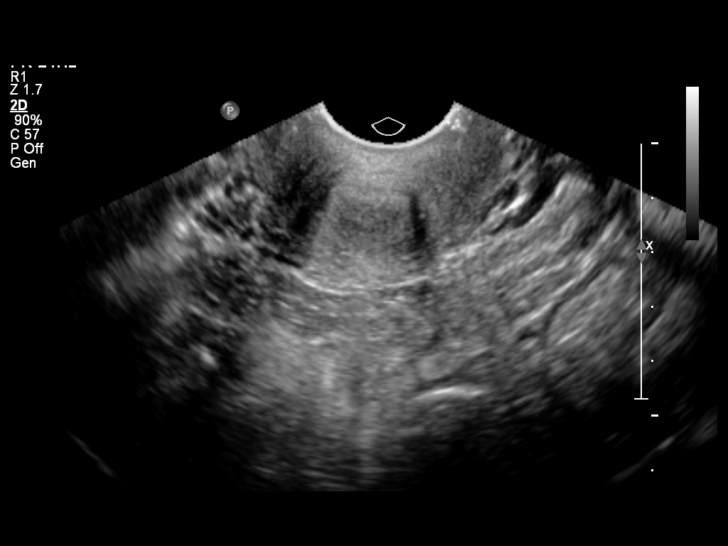
[im 35/53]
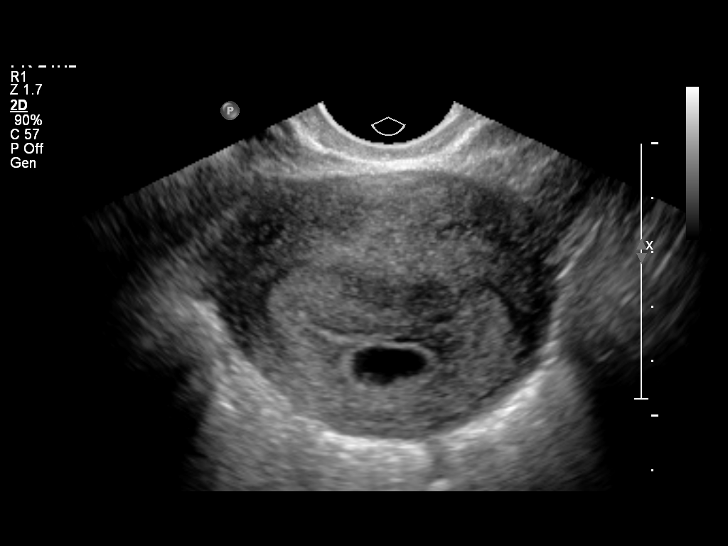
[im 39/53]
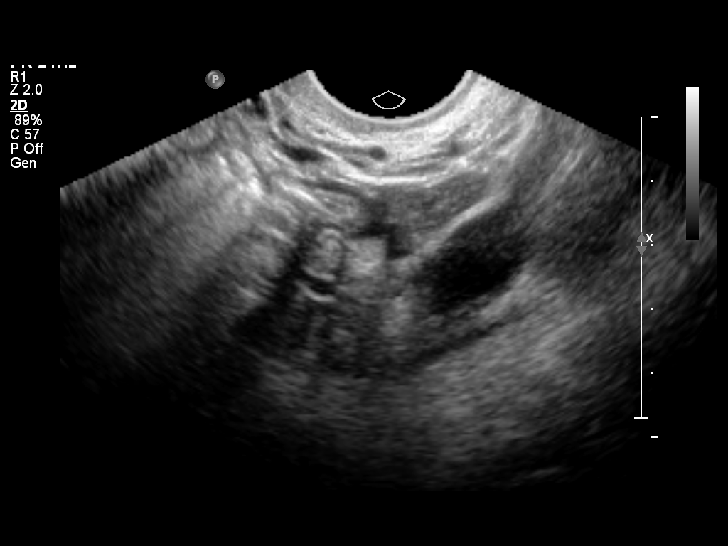
[im 43/53]
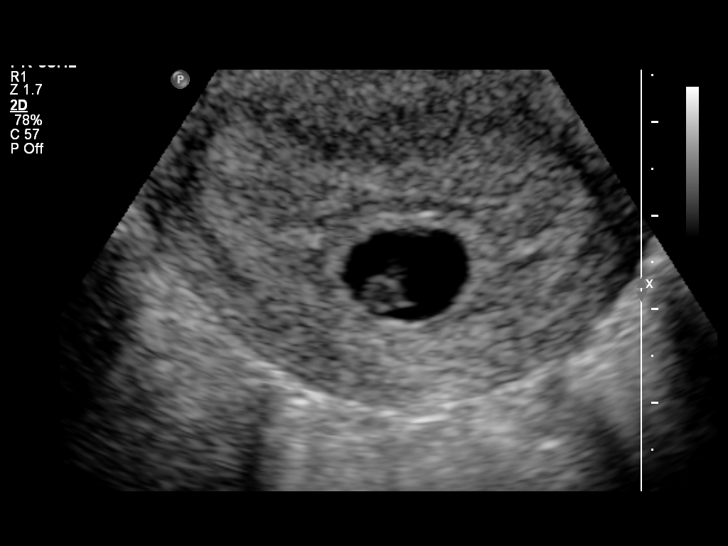
[im 47/53]
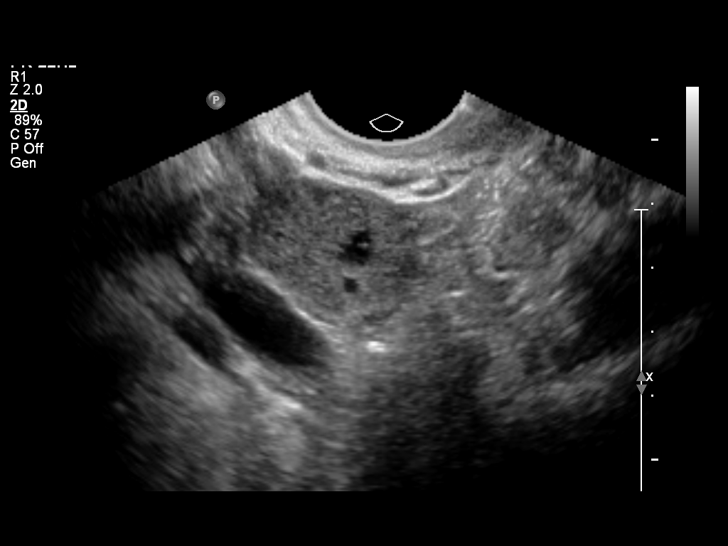
[im 51/53]
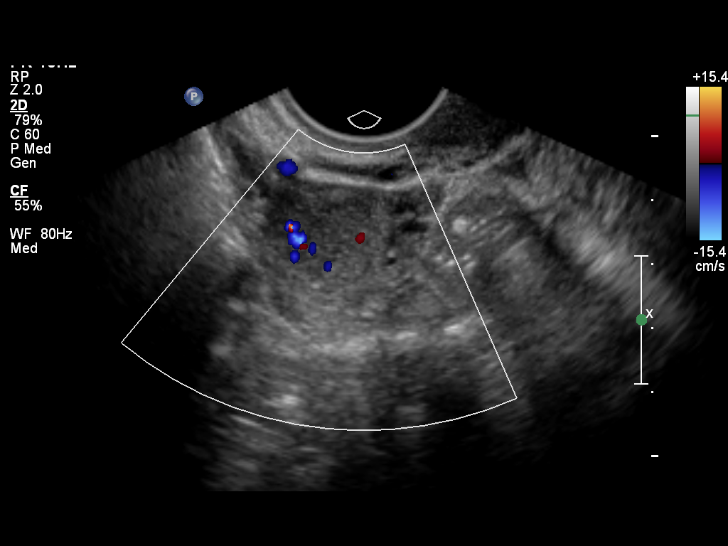

[13 of 28 positions shown; findings below may reference images not displayed]

FINDINGS: Intrauterine gestational sac: Visualized/normal in shape.

Yolk sac:  Present

Embryo:  Present

Cardiac Activity: Not visualized

CRL:   5.1  mm   6 w 2 d                  US EDC: 02/15/2014

Maternal uterus/adnexae: No serve chronic hemorrhage.

Left ovary is within normal limits, measuring 1.1 x 2.4 x 1.3 cm.

Right ovary measures 2.1 by 2.7 x 2.2 cm and is notable for a 1.2 cm
corpus luteal cyst.

No free fluid.
IMPRESSION: Single intrauterine gestation with estimated gestational age 6 weeks
2 days by crown-rump length.

No cardiac activity is demonstrated. Findings are suspicious but not
yet definitive for failed pregnancy given crown-rump length less
than 7 mm.

Recommend follow-up US in 10-14 days for definitive diagnosis. This
recommendation follows SRU consensus guidelines: Diagnostic Criteria
for Nonviable Pregnancy Early in the First Trimester. N Engl J Med

## 2017-03-09 NOTE — Telephone Encounter (Signed)
See note

## 2017-06-25 ENCOUNTER — Other Ambulatory Visit: Payer: Self-pay

## 2017-06-25 ENCOUNTER — Inpatient Hospital Stay (HOSPITAL_COMMUNITY): Payer: Medicaid Other

## 2017-06-25 ENCOUNTER — Inpatient Hospital Stay (HOSPITAL_COMMUNITY)
Admission: AD | Admit: 2017-06-25 | Discharge: 2017-06-25 | Disposition: A | Discharge status: Home / Self Care | Payer: Medicaid Other | Source: Ambulatory Visit | Attending: Obstetrics and Gynecology | Admitting: Obstetrics and Gynecology

## 2017-06-25 ENCOUNTER — Encounter (HOSPITAL_COMMUNITY): Payer: Self-pay | Admitting: *Deleted

## 2017-06-25 DIAGNOSIS — R1031 Right lower quadrant pain: Secondary | ICD-10-CM | POA: Insufficient documentation

## 2017-06-25 DIAGNOSIS — Z3A01 Less than 8 weeks gestation of pregnancy: Secondary | ICD-10-CM

## 2017-06-25 DIAGNOSIS — O99331 Smoking (tobacco) complicating pregnancy, first trimester: Secondary | ICD-10-CM | POA: Diagnosis not present

## 2017-06-25 DIAGNOSIS — O3680X0 Pregnancy with inconclusive fetal viability, not applicable or unspecified: Secondary | ICD-10-CM | POA: Diagnosis not present

## 2017-06-25 DIAGNOSIS — O26891 Other specified pregnancy related conditions, first trimester: Secondary | ICD-10-CM

## 2017-06-25 DIAGNOSIS — F17210 Nicotine dependence, cigarettes, uncomplicated: Secondary | ICD-10-CM | POA: Diagnosis not present

## 2017-06-25 DIAGNOSIS — R109 Unspecified abdominal pain: Secondary | ICD-10-CM

## 2017-06-25 DIAGNOSIS — O26899 Other specified pregnancy related conditions, unspecified trimester: Secondary | ICD-10-CM

## 2017-06-25 LAB — URINALYSIS, ROUTINE W REFLEX MICROSCOPIC
Bilirubin Urine: NEGATIVE
GLUCOSE, UA: NEGATIVE mg/dL
Hgb urine dipstick: NEGATIVE
Ketones, ur: NEGATIVE mg/dL
Nitrite: NEGATIVE
PROTEIN: NEGATIVE mg/dL
Specific Gravity, Urine: 1.006 (ref 1.005–1.030)
pH: 6 (ref 5.0–8.0)

## 2017-06-25 LAB — WET PREP, GENITAL
Clue Cells Wet Prep HPF POC: NONE SEEN
SPERM: NONE SEEN
Trich, Wet Prep: NONE SEEN
Yeast Wet Prep HPF POC: NONE SEEN

## 2017-06-25 LAB — CBC
HEMATOCRIT: 36.2 % (ref 36.0–46.0)
HEMOGLOBIN: 12.3 g/dL (ref 12.0–15.0)
MCH: 29.3 pg (ref 26.0–34.0)
MCHC: 34 g/dL (ref 30.0–36.0)
MCV: 86.2 fL (ref 78.0–100.0)
Platelets: 183 10*3/uL (ref 150–400)
RBC: 4.2 MIL/uL (ref 3.87–5.11)
RDW: 12.4 % (ref 11.5–15.5)
WBC: 5.9 10*3/uL (ref 4.0–10.5)

## 2017-06-25 LAB — HCG, QUANTITATIVE, PREGNANCY: hCG, Beta Chain, Quant, S: 9333 m[IU]/mL — ABNORMAL HIGH (ref <5)

## 2017-06-25 LAB — POCT PREGNANCY, URINE: Preg Test, Ur: POSITIVE — AB

## 2017-06-25 NOTE — Discharge Instructions (Signed)

## 2017-06-25 NOTE — MAU Note (Signed)
Has this really bad pain in RLQ for past 4 days, became severe yesterday- had to leave work early because of pain. +HPT last Tues.  "googled it " and is terrified.

## 2017-06-25 NOTE — MAU Provider Note (Signed)
History     CSN: [id number]  Arrival date and time: 06/25/17 1427   First Provider Initiated Contact with Patient 06/25/17 1505      Chief Complaint  Patient presents with  . Abdominal Pain  . Possible Pregnancy   [id number] @[redacted]w[redacted]d  here with RLQ pain. Pain started 4 days ago. Describes as intermittent and sharp stabbing. Aggravating factors is walking, better with rest. She has not used anything for it. Denies urinary sx. No VB or discharge.    OB History    Gravida Para Term Preterm AB Living   5 1 1   3 1    SAB TAB Ectopic Multiple Live Births   1 2     1       Past Medical History:  Diagnosis Date  . Hx of chlamydia infection   . Migraine     History reviewed. No pertinent surgical history.  Family History  Problem Relation Age of Onset  . Hypertension Maternal Grandmother   . Cancer Paternal Grandfather        bladder, throat    Social History   Tobacco Use  . Smoking status: Current Every Day Smoker    Packs/day: 1.00    Years: 5.00    Pack years: 5.00    Types: Cigarettes  . Smokeless tobacco: Never Used  Substance Use Topics  . Alcohol use: No  . 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    Allergies: No Known Allergies  No medications prior to admission.    Review of Systems  Constitutional: Negative for chills and fever.  Gastrointestinal: Negative for constipation and diarrhea.  Genitourinary: Positive for pelvic pain. Negative for dysuria, frequency, hematuria, vaginal bleeding and vaginal discharge.   Physical Exam   Blood pressure 119/67, pulse 94, temperature 98.9 F (37.2 C), temperature source Oral, resp. rate 16, height 5\' 5"  (1.651 m), weight 139 lb (63 kg), last menstrual period 05/22/2017, SpO2 100 %, unknown if currently breastfeeding.  Physical Exam  Constitutional: She is oriented to person, place, and time. She appears well-developed and well-nourished. No  distress.  HENT:  Head: Normocephalic and atraumatic.  Neck: Normal range of motion.  Respiratory: Effort normal. No respiratory distress.  GI: Soft. She exhibits no distension and no mass. There is no tenderness. There is no rebound and no guarding.  Genitourinary:  Genitourinary Comments: External: no lesions or erythema Vagina: rugated, pink, moist, scant thin white discharge Uterus: non enlarged, anteverted, non tender, no CMT Adnexae: no masses, no tenderness left, no tenderness right   Musculoskeletal: Normal range of motion.  Neurological: She is alert and oriented to person, place, and time.  Skin: Skin is warm and dry.  Psychiatric: She has a normal mood and affect.   Results for orders placed or performed during the hospital encounter of 06/25/17 (from the past 24 hour(s))  Urinalysis, Routine w reflex microscopic     Status: Abnormal   Collection Time: 06/25/17  2:50 PM  Result Value Ref Range   Color, Urine STRAW (A) YELLOW   APPearance HAZY (A) CLEAR   Specific Gravity, Urine 1.006 1.005 - 1.030   pH 6.0 5.0 - 8.0   Glucose, UA NEGATIVE NEGATIVE mg/dL   Hgb urine dipstick NEGATIVE NEGATIVE   Bilirubin Urine NEGATIVE NEGATIVE   Ketones, ur NEGATIVE NEGATIVE mg/dL   Protein, ur NEGATIVE NEGATIVE mg/dL   Nitrite NEGATIVE NEGATIVE  Leukocytes, UA MODERATE (A) NEGATIVE   RBC / HPF 0-5 0 - 5 RBC/hpf   WBC, UA 6-30 0 - 5 WBC/hpf   Bacteria, UA FEW (A) NONE SEEN   Squamous Epithelial / LPF 0-5 (A) NONE SEEN  Pregnancy, urine POC     Status: Abnormal   Collection Time: 06/25/17  2:57 PM  Result Value Ref Range   Preg Test, Ur POSITIVE (A) NEGATIVE  CBC     Status: None   Collection Time: 06/25/17  3:10 PM  Result Value Ref Range   WBC 5.9 4.0 - 10.5 K/uL   RBC 4.20 3.87 - 5.11 MIL/uL   Hemoglobin 12.3 12.0 - 15.0 g/dL   HCT 16.1 09.6 - 04.5 %   MCV 86.2 78.0 - 100.0 fL   MCH 29.3 26.0 - 34.0 pg   MCHC 34.0 30.0 - 36.0 g/dL   RDW 40.9 81.1 - 91.4 %   Platelets  183 150 - 400 K/uL  hCG, quantitative, pregnancy     Status: Abnormal   Collection Time: 06/25/17  3:10 PM  Result Value Ref Range   hCG, Beta Chain, Quant, S 9,333 (H) <5 mIU/mL  Wet prep, genital     Status: Abnormal   Collection Time: 06/25/17  3:10 PM  Result Value Ref Range   Yeast Wet Prep HPF POC NONE SEEN NONE SEEN   Trich, Wet Prep NONE SEEN NONE SEEN   Clue Cells Wet Prep HPF POC NONE SEEN NONE SEEN   WBC, Wet Prep HPF POC MODERATE (A) NONE SEEN   Sperm NONE SEEN    US Ob Comp Less 14 Wks  Result Date: 06/25/2017 CLINICAL DATA:  Right lower quadrant pain for 4 days. Beta HCG pending. EXAM: OBSTETRIC <14 WK [place] AND TRANSVAGINAL OB US TECHNIQUE: Both transabdominal and transvaginal ultrasound examinations were performed for complete evaluation of the gestation as well as the maternal uterus, adnexal regions, and pelvic cul-de-sac. Transvaginal technique was performed to assess early pregnancy. COMPARISON:  None. FINDINGS: Intrauterine gestational sac: Small irregular sac-like collection within the fundal region of the uterus, probable gestational sac. Yolk sac:  Not seen. Embryo:  Not seen. Cardiac Activity: Not seen Heart Rate:   bpm MSD: 5.6 mm  mm   5 w   2  d CRL:    mm    w    d                  [place] EDC: Subchorionic hemorrhage:  None visualized. Maternal uterus/adnexae: Maternal right ovary appears normal and there is no mass or free fluid seen within the right adnexal region. Left ovary is not seen. Left adnexa is difficult to visualize due to obscuring stool and bowel gas. IMPRESSION: 1. Small irregular sac-like collection within the fundal region of the uterus, probable early gestational sac. The irregular configuration is suspicious for abnormal pregnancy. No yolk sac or fetal pole identified to confirm gestational sac. Recommend follow-up with serial beta HCG levels and follow-up obstetric ultrasound as needed to ensure normal pregnancy. 2. Right ovary appears normal and there is no  mass or free fluid identified in the right adnexal region. 3. Left ovary is not seen. No obvious mass or free fluid in the left adnexa, however, characterization is limited by overlying stool and bowel gas. Electronically Signed   By: Bary Richard M.D.   On: 06/25/2017 16:09   US Ob Transvaginal  Result Date: 06/25/2017 CLINICAL DATA:  Right lower quadrant pain for  4 days. Beta HCG pending. EXAM: OBSTETRIC <14 WK US AND TRANSVAGINAL OB US TECHNIQUE: Both transabdominal and transvaginal ultrasound examinations were performed for complete evaluation of the gestation as well as the maternal uterus, adnexal regions, and pelvic cul-de-sac. Transvaginal technique was performed to assess early pregnancy. COMPARISON:  None. FINDINGS: Intrauterine gestational sac: Small irregular sac-like collection within the fundal region of the uterus, probable gestational sac. Yolk sac:  Not seen. Embryo:  Not seen. Cardiac Activity: Not seen Heart Rate:   bpm MSD: 5.6 mm  mm   5 w   2  d CRL:    mm    w    d                  US EDC: Subchorionic hemorrhage:  None visualized. Maternal uterus/adnexae: Maternal right ovary appears normal and there is no mass or free fluid seen within the right adnexal region. Left ovary is not seen. Left adnexa is difficult to visualize due to obscuring stool and bowel gas. IMPRESSION: 1. Small irregular sac-like collection within the fundal region of the uterus, probable early gestational sac. The irregular configuration is suspicious for abnormal pregnancy. No yolk sac or fetal pole identified to confirm gestational sac. Recommend follow-up with serial beta HCG levels and follow-up obstetric ultrasound as needed to ensure normal pregnancy. 2. Right ovary appears normal and there is no mass or free fluid identified in the right adnexal region. 3. Left ovary is not seen. No obvious mass or free fluid in the left adnexa, however, characterization is limited by overlying stool and bowel gas.  Electronically Signed   By: Bary RichardStan  Maynard M.D.   On: 06/25/2017 16:09   MAU Course  Procedures  MDM Labs and US ordered and reviewed. Irregular IUGS seen and no YS or FP therefore cannot r/o failed pregnancy, early pregnancy or ectopic. Will f/u quant HCG in 48 hrs in office. Presentation, clinical findings, and plan discussed with Dr. Ellyn HackBovard. Stable for discharge home.  Assessment and Plan   1. Pregnancy of unknown anatomic location   2. Abdominal pain affecting pregnancy    Discharge home Follow up at [facility] this week on 06/27/17 Ectopic/return precautions Tylenol prn Heating pad prn  Allergies as of 06/25/2017   No Known Allergies     Medication List    TAKE these medications   prenatal multivitamin Tabs tablet Take 1 tablet daily at 12 noon by mouth.      Donette LarryMelanie Shantia Sanford, CNM 06/25/2017, 5:32 PM

## 2017-06-26 LAB — GC/CHLAMYDIA PROBE AMP (~~LOC~~) NOT AT ARMC
CHLAMYDIA, DNA PROBE: NEGATIVE
Neisseria Gonorrhea: NEGATIVE

## 2017-08-07 DIAGNOSIS — Z368A Encounter for antenatal screening for other genetic defects: Secondary | ICD-10-CM | POA: Diagnosis not present

## 2017-08-07 LAB — OB RESULTS CONSOLE ABO/RH: RH TYPE: POSITIVE

## 2017-08-07 LAB — OB RESULTS CONSOLE HIV ANTIBODY (ROUTINE TESTING): HIV: NONREACTIVE

## 2017-08-07 LAB — OB RESULTS CONSOLE GC/CHLAMYDIA
Chlamydia: NEGATIVE
Gonorrhea: NEGATIVE

## 2017-08-07 LAB — OB RESULTS CONSOLE RPR: RPR: NONREACTIVE

## 2017-08-07 LAB — OB RESULTS CONSOLE ANTIBODY SCREEN: Antibody Screen: NEGATIVE

## 2017-08-07 LAB — OB RESULTS CONSOLE RUBELLA ANTIBODY, IGM: Rubella: IMMUNE

## 2017-08-07 LAB — OB RESULTS CONSOLE HEPATITIS B SURFACE ANTIGEN: Hepatitis B Surface Ag: NEGATIVE

## 2017-08-08 NOTE — L&D Delivery Note (Signed)
Delivery Note Pt reached C/C/+2 - pushed x 3 ctx for delivery.  At 2:56 AM a viable and healthy female was delivered via Vaginal, Spontaneous (Presentation: OA, LOT  ).  APGAR: 9, 9; weight  .   Placenta status: delivered, intact .  Cord: 3V  with the following complications: nuchal x 1.   Anesthesia: epidural  Episiotomy: None Lacerations: None Suture Repair: N/A Est. Blood Loss (mL): 100cc  Mom to postpartum.  Baby to Couplet care / Skin to Skin.  Deborah Russell 02/17/2018, 3:13 AM  O+/RI/Tdap in PNC/Contra?/Br Deatra CanterEmerson

## 2018-01-19 LAB — OB RESULTS CONSOLE GBS: GBS: NEGATIVE

## 2018-02-15 ENCOUNTER — Telehealth (HOSPITAL_COMMUNITY): Payer: Self-pay | Admitting: *Deleted

## 2018-02-15 NOTE — Telephone Encounter (Signed)
Preadmission screen  

## 2018-02-16 ENCOUNTER — Encounter (HOSPITAL_COMMUNITY): Payer: Self-pay | Admitting: *Deleted

## 2018-02-16 ENCOUNTER — Inpatient Hospital Stay (HOSPITAL_COMMUNITY)
Admission: AD | Admit: 2018-02-16 | Discharge: 2018-02-18 | DRG: 806 | Disposition: A | Discharge status: Home / Self Care | Payer: Medicaid Other | Source: Ambulatory Visit | Attending: Obstetrics and Gynecology | Admitting: Obstetrics and Gynecology

## 2018-02-16 ENCOUNTER — Inpatient Hospital Stay (HOSPITAL_COMMUNITY): Payer: Medicaid Other | Admitting: Anesthesiology

## 2018-02-16 ENCOUNTER — Other Ambulatory Visit: Payer: Self-pay

## 2018-02-16 DIAGNOSIS — A609 Anogenital herpesviral infection, unspecified: Secondary | ICD-10-CM | POA: Diagnosis present

## 2018-02-16 DIAGNOSIS — Z87891 Personal history of nicotine dependence: Secondary | ICD-10-CM

## 2018-02-16 DIAGNOSIS — Z3490 Encounter for supervision of normal pregnancy, unspecified, unspecified trimester: Secondary | ICD-10-CM

## 2018-02-16 DIAGNOSIS — O9832 Other infections with a predominantly sexual mode of transmission complicating childbirth: Secondary | ICD-10-CM | POA: Diagnosis present

## 2018-02-16 DIAGNOSIS — Z3A38 38 weeks gestation of pregnancy: Secondary | ICD-10-CM | POA: Diagnosis not present

## 2018-02-16 DIAGNOSIS — Z3483 Encounter for supervision of other normal pregnancy, third trimester: Secondary | ICD-10-CM | POA: Diagnosis present

## 2018-02-16 DIAGNOSIS — O4290 Premature rupture of membranes, unspecified as to length of time between rupture and onset of labor, unspecified weeks of gestation: Secondary | ICD-10-CM

## 2018-02-16 DIAGNOSIS — O99334 Smoking (tobacco) complicating childbirth: Secondary | ICD-10-CM | POA: Diagnosis present

## 2018-02-16 HISTORY — DX: Premature rupture of membranes, unspecified as to length of time between rupture and onset of labor, unspecified weeks of gestation: O42.90

## 2018-02-16 LAB — CBC WITH DIFFERENTIAL/PLATELET
BASOS ABS: 0 10*3/uL (ref 0.0–0.1)
Basophils Relative: 0 %
Eosinophils Absolute: 0.1 10*3/uL (ref 0.0–0.7)
Eosinophils Relative: 1 %
HEMATOCRIT: 37.1 % (ref 36.0–46.0)
Hemoglobin: 11.8 g/dL — ABNORMAL LOW (ref 12.0–15.0)
LYMPHS PCT: 17 %
Lymphs Abs: 1.7 10*3/uL (ref 0.7–4.0)
MCH: 24 pg — AB (ref 26.0–34.0)
MCHC: 31.8 g/dL (ref 30.0–36.0)
MCV: 75.4 fL — AB (ref 78.0–100.0)
MONO ABS: 0.3 10*3/uL (ref 0.1–1.0)
MONOS PCT: 4 %
NEUTROS ABS: 7.4 10*3/uL (ref 1.7–7.7)
Neutrophils Relative %: 78 %
Platelets: 156 10*3/uL (ref 150–400)
RBC: 4.92 MIL/uL (ref 3.87–5.11)
RDW: 14 % (ref 11.5–15.5)
WBC: 9.5 10*3/uL (ref 4.0–10.5)

## 2018-02-16 LAB — TYPE AND SCREEN
ABO/RH(D): O POS
Antibody Screen: NEGATIVE

## 2018-02-16 LAB — ABO/RH: ABO/RH(D): O POS

## 2018-02-16 LAB — POCT FERN TEST: POCT FERN TEST: POSITIVE

## 2018-02-16 MED ORDER — EPHEDRINE 5 MG/ML INJ
10.0000 mg | INTRAVENOUS | Status: DC | PRN
  Filled 2018-02-16: qty 2

## 2018-02-16 MED ORDER — FENTANYL 2.5 MCG/ML BUPIVACAINE 1/10 % EPIDURAL INFUSION (WH - ANES)
INTRAMUSCULAR | Status: AC
  Filled 2018-02-16: qty 100

## 2018-02-16 MED ORDER — FENTANYL 2.5 MCG/ML BUPIVACAINE 1/10 % EPIDURAL INFUSION (WH - ANES)
14.0000 mL/h | INTRAMUSCULAR | Status: DC | PRN
  Administered 2018-02-16 – 2018-02-17 (×2): 14 mL/h via EPIDURAL
  Filled 2018-02-16: qty 100

## 2018-02-16 MED ORDER — DIPHENHYDRAMINE HCL 50 MG/ML IJ SOLN: 12.5000 mg | INTRAMUSCULAR | Status: DC | PRN

## 2018-02-16 MED ORDER — BUTORPHANOL TARTRATE 1 MG/ML IJ SOLN: 1.0000 mg | INTRAMUSCULAR | Status: DC | PRN

## 2018-02-16 MED ORDER — TERBUTALINE SULFATE 1 MG/ML IJ SOLN: 0.2500 mg | Freq: Once | INTRAMUSCULAR | Status: DC | PRN

## 2018-02-16 MED ORDER — LACTATED RINGERS IV SOLN: 500.0000 mL | Freq: Once | INTRAVENOUS | Status: DC

## 2018-02-16 MED ORDER — PHENYLEPHRINE 40 MCG/ML (10ML) SYRINGE FOR IV PUSH (FOR BLOOD PRESSURE SUPPORT)
80.0000 ug | PREFILLED_SYRINGE | INTRAVENOUS | Status: DC | PRN
  Filled 2018-02-16: qty 5

## 2018-02-16 MED ORDER — LACTATED RINGERS IV SOLN
INTRAVENOUS | Status: DC
  Administered 2018-02-16: 15:00:00 via INTRAVENOUS
  Administered 2018-02-16: 125 mL/h via INTRAVENOUS

## 2018-02-16 MED ORDER — OXYTOCIN 40 UNITS IN LACTATED RINGERS INFUSION - SIMPLE MED: 1.0000 m[IU]/min | INTRAVENOUS | Status: DC

## 2018-02-16 MED ORDER — OXYTOCIN 40 UNITS IN LACTATED RINGERS INFUSION - SIMPLE MED
1.0000 m[IU]/min | INTRAVENOUS | Status: DC
  Administered 2018-02-16: 6 m[IU]/min via INTRAVENOUS
  Administered 2018-02-16: 4 m[IU]/min via INTRAVENOUS
  Administered 2018-02-16: 2 m[IU]/min via INTRAVENOUS
  Administered 2018-02-16: 12 m[IU]/min via INTRAVENOUS
  Administered 2018-02-16: 14 m[IU]/min via INTRAVENOUS
  Administered 2018-02-16: 8 m[IU]/min via INTRAVENOUS
  Administered 2018-02-16: 10 m[IU]/min via INTRAVENOUS
  Filled 2018-02-16: qty 1000

## 2018-02-16 MED ORDER — LACTATED RINGERS IV SOLN
500.0000 mL | Freq: Once | INTRAVENOUS | Status: AC
  Administered 2018-02-16: 500 mL via INTRAVENOUS

## 2018-02-16 MED ORDER — TERBUTALINE SULFATE 1 MG/ML IJ SOLN
0.2500 mg | Freq: Once | INTRAMUSCULAR | Status: DC | PRN
  Filled 2018-02-16: qty 1

## 2018-02-16 MED ORDER — LIDOCAINE HCL (PF) 1 % IJ SOLN
INTRAMUSCULAR | Status: DC | PRN
  Administered 2018-02-16: 5 mL via EPIDURAL
  Administered 2018-02-16: 2 mL via EPIDURAL
  Administered 2018-02-16: 3 mL via EPIDURAL

## 2018-02-16 NOTE — Anesthesia Pain Management Evaluation Note (Signed)
  CRNA Pain Management Visit Note  Patient: Deborah Russell, 27 y.o., female  "Hello [facility] am [facility]. We have an anesthesia team available at all times to provide care throughout the hospital, including epidural management and anesthesia for C-section. I don't know your plan for the delivery whether it a natural birth, water birth, IV sedation, nitrous supplementation, doula or epidural, but we want to meet your pain goals."   1.Was your pain managed to your expectations on prior hospitalizations?   Yes   2.What is your expectation for pain management during this hospitalization?     Epidural support 3.How can we help you reach that goal? support  Record the patient's initial score and the patient's pain goal.   Pain: 4  Pain Goal: 7 [facility] wants you to be able to say your pain was always managed very well.  Trellis PaganiniBREWER,Ranette Luckadoo N 02/16/2018

## 2018-02-16 NOTE — Progress Notes (Signed)
Patient ID: Deborah FossaDanielle M Hodges, female   DOB: 1991/04/13, 27 y.o.   MRN: [id number]   Comfortable with epidural  AFVSS gen NAD FHTs 140's, mod var, +accel, some early/variabe.  Category 1 toco q3 -5min  SVE 4.5/70/-3  IUPC placed w/o diff/comp  Continue IOL Expect SVD

## 2018-02-16 NOTE — H&P (Signed)
Deborah Russell is a 27 y.o. female [phone] at 38+ with ROM.  Pt with some ctx.relatively uncomplicated PNC.  Normal first trimester screen and AFP.  Has Herpes, but has been on supression.    OB History    Gravida  5   Para  1   Term  1   Preterm      AB  3   Living  1     SAB  1   TAB  2   Ectopic      Multiple      Live Births  1         TAB, SAB x 2 SVD 40wk  No abn pap +Chl - remote  Past Medical History:  Diagnosis Date  . HSV (herpes simplex virus) anogenital infection   . Hx of chlamydia infection   . Migraine    Past Surgical History:  Procedure Laterality Date  . NO PAST SURGERIES     Family History: family history includes Cancer in her paternal grandfather; Hypertension in her maternal grandmother. Social History:  reports that she quit smoking about 7 months ago. Her smoking use included cigarettes. She has a 5.00 pack-year smoking history. She has never used smokeless tobacco. She reports that she does not drink alcohol or use drugs.single Meds PNV, Valtrex All: NKDA     Maternal Diabetes: No Genetic Screening: Normal Maternal Ultrasounds/Referrals: Normal Fetal Ultrasounds or other Referrals:  None Maternal Substance Abuse:  No Significant Maternal Medications:  None Significant Maternal Lab Results:  Lab values include: Group B Strep negative Other Comments:  None  Review of Systems  Constitutional: Negative.   HENT: Negative.   Eyes: Negative.   Respiratory: Negative.   Cardiovascular: Negative.   Gastrointestinal: Negative.   Genitourinary: Negative.   Musculoskeletal: Negative.   Skin: Negative.   Neurological: Negative.   Psychiatric/Behavioral: Negative.    Maternal Medical History:  Reason for admission: Rupture of membranes.   Contractions: Frequency: irregular.    Fetal activity: Perceived fetal activity is normal.    Prenatal Complications - Diabetes: none.    Dilation: 3 Effacement (%): 70 Station:  -2 Exam by:: Bovard Blood pressure 118/64, pulse 88, temperature 98.5 F (36.9 C), temperature source Oral, resp. rate 18, height 5\' 5"  (1.651 m), weight 83.9 kg (185 lb), last menstrual period 05/22/2017, SpO2 98 %, unknown if currently breastfeeding. Maternal Exam:  Abdomen: Patient reports no abdominal tenderness. Fundal height is appropriate for gestation.   Estimated fetal weight is 7-8#.   Fetal presentation: vertex  Introitus: Normal vulva. Normal vagina.    Physical Exam  Constitutional: She is oriented to person, place, and time. She appears well-developed and well-nourished.  HENT:  Head: Normocephalic and atraumatic.  Cardiovascular: Normal rate and regular rhythm.  Respiratory: Effort normal and breath sounds normal. No respiratory distress. She has no wheezes.  GI: Soft. Bowel sounds are normal. She exhibits no distension. There is no tenderness.  Musculoskeletal: Normal range of motion.  Neurological: She is alert and oriented to person, place, and time.  Skin: Skin is warm and dry.  Psychiatric: She has a normal mood and affect. Her behavior is normal.    Prenatal labs: ABO, Rh: --/--/O POS (07/12 1522) Antibody: NEG (07/12 1522) Rubella: Immune (12/31 0000) RPR: Nonreactive (12/31 0000)  HBsAg: Negative (12/31 0000)  HIV: Non-reactive (12/31 0000)  GBS: Negative (06/14 0000)  Hgb 12.4/Plt 197/Ur Cx neg/GC neg/Chl neg/Varicella immune/glucola 131  Nl NT, nl first tri [place] AFP  WNL Nl AFI, nl anat, ant plac (female)  Assessment/Plan: 18JY 89 at 38+ with SROM gbbs neg Epidural prn Expect SVD   Hughie Melroy Bovard-Stuckert 02/16/2018, 5:54 PM

## 2018-02-16 NOTE — Anesthesia Preprocedure Evaluation (Signed)
Anesthesia Evaluation  Patient identified by MRN, date of birth, ID band Patient awake    Reviewed: Allergy & Precautions, NPO status , Patient's Chart, lab work & pertinent test results  Airway Mallampati: II  TM Distance: >3 FB Neck ROM: Full    Dental  (+) Teeth Intact, Dental Advisory Given   Pulmonary former smoker,    Pulmonary exam normal breath sounds clear to auscultation       Cardiovascular negative cardio ROS Normal cardiovascular exam Rhythm:Regular Rate:Normal     Neuro/Psych  Headaches,    GI/Hepatic negative GI ROS, Neg liver ROS,   Endo/Other  Obesity   Renal/GU negative Renal ROS     Musculoskeletal negative musculoskeletal ROS (+)   Abdominal   Peds  Hematology  (+) Blood dyscrasia, anemia , Plt 156k    Anesthesia Other Findings Day of surgery medications reviewed with the patient.  Reproductive/Obstetrics (+) Pregnancy                             Anesthesia Physical Anesthesia Plan  ASA: II  Anesthesia Plan: Epidural   Post-op Pain Management:    Induction:   PONV Risk Score and Plan: 2 and Treatment may vary due to age or medical condition  Airway Management Planned:   Additional Equipment:   Intra-op Plan:   Post-operative Plan:   Informed Consent: I have reviewed the patients History and Physical, chart, labs and discussed the procedure including the risks, benefits and alternatives for the proposed anesthesia with the patient or authorized representative who has indicated his/her understanding and acceptance.   Dental advisory given  Plan Discussed with:   Anesthesia Plan Comments: (Patient identified. Risks/Benefits/Options discussed with patient including but not limited to bleeding, infection, nerve damage, paralysis, failed block, incomplete pain control, headache, blood pressure changes, nausea, vomiting, reactions to medication both or  allergic, itching and postpartum back pain. Confirmed with bedside nurse the patient's most recent platelet count. Confirmed with patient that they are not currently taking any anticoagulation, have any bleeding history or any family history of bleeding disorders. Patient expressed understanding and wished to proceed. All questions were answered. )        Anesthesia Quick Evaluation

## 2018-02-16 NOTE — Anesthesia Procedure Notes (Signed)
Epidural Patient location during procedure: OB Start time: 02/16/2018 5:14 PM End time: 02/16/2018 5:19 PM  Staffing Anesthesiologist: Cecile Hearingurk, Charla Criscione Edward, MD Performed: anesthesiologist   Preanesthetic Checklist Completed: patient identified, pre-op evaluation, timeout performed, IV checked, risks and benefits discussed and monitors and equipment checked  Epidural Patient position: sitting Prep: DuraPrep Patient monitoring: blood pressure and continuous pulse ox Approach: midline Location: L3-L4 Injection technique: LOR air  Needle:  Needle type: Tuohy  Needle gauge: 17 G Needle length: 9 cm Needle insertion depth: 5 cm Catheter size: 19 Gauge Catheter at skin depth: 10 cm Test dose: negative and Other (1% Lidocaine)  Additional Notes Patient identified.  Risk benefits discussed including failed block, incomplete pain control, headache, nerve damage, paralysis, blood pressure changes, nausea, vomiting, reactions to medication both toxic or allergic, and postpartum back pain.  Patient expressed understanding and wished to proceed.  All questions were answered.  Sterile technique used throughout procedure and epidural site dressed with sterile barrier dressing. No paresthesia or other complications noted. The patient did not experience any signs of intravascular injection such as tinnitus or metallic taste in mouth nor signs of intrathecal spread such as rapid motor block. Please see nursing notes for vital signs. Reason for block:procedure for pain

## 2018-02-16 NOTE — MAU Note (Signed)
Started leaking around 1130, clear fluid still coming.  No bleeding. Some cramping- mild. Was 2 cm yesterday

## 2018-02-17 ENCOUNTER — Encounter (HOSPITAL_COMMUNITY): Payer: Self-pay | Admitting: Obstetrics and Gynecology

## 2018-02-17 DIAGNOSIS — Z3490 Encounter for supervision of normal pregnancy, unspecified, unspecified trimester: Secondary | ICD-10-CM

## 2018-02-17 LAB — CBC
HCT: 28.1 % — ABNORMAL LOW (ref 36.0–46.0)
HEMOGLOBIN: 9.1 g/dL — AB (ref 12.0–15.0)
MCH: 24.4 pg — AB (ref 26.0–34.0)
MCHC: 32.4 g/dL (ref 30.0–36.0)
MCV: 75.3 fL — AB (ref 78.0–100.0)
Platelets: 136 10*3/uL — ABNORMAL LOW (ref 150–400)
RBC: 3.73 MIL/uL — ABNORMAL LOW (ref 3.87–5.11)
RDW: 14 % (ref 11.5–15.5)
WBC: 12.4 10*3/uL — ABNORMAL HIGH (ref 4.0–10.5)

## 2018-02-17 LAB — RPR: RPR: NONREACTIVE

## 2018-02-17 MED ORDER — LACTATED RINGERS IV SOLN: INTRAVENOUS | Status: DC

## 2018-02-17 MED ORDER — ACETAMINOPHEN 325 MG PO TABS: 650.0000 mg | ORAL_TABLET | ORAL | Status: DC | PRN

## 2018-02-17 MED ORDER — OXYCODONE HCL 5 MG PO TABS: 5.0000 mg | ORAL_TABLET | ORAL | Status: DC | PRN

## 2018-02-17 MED ORDER — ONDANSETRON HCL 4 MG/2ML IJ SOLN: 4.0000 mg | INTRAMUSCULAR | Status: DC | PRN

## 2018-02-17 MED ORDER — BENZOCAINE-MENTHOL 20-0.5 % EX AERO: 1.0000 "application " | INHALATION_SPRAY | CUTANEOUS | Status: DC | PRN

## 2018-02-17 MED ORDER — OXYTOCIN 40 UNITS IN LACTATED RINGERS INFUSION - SIMPLE MED: 2.5000 [IU]/h | INTRAVENOUS | Status: DC

## 2018-02-17 MED ORDER — ONDANSETRON HCL 4 MG PO TABS: 4.0000 mg | ORAL_TABLET | ORAL | Status: DC | PRN

## 2018-02-17 MED ORDER — PRENATAL MULTIVITAMIN CH: 1.0000 | ORAL_TABLET | Freq: Every day | ORAL | Status: DC

## 2018-02-17 MED ORDER — OXYTOCIN BOLUS FROM INFUSION
500.0000 mL | Freq: Once | INTRAVENOUS | Status: AC
  Administered 2018-02-17: 500 mL via INTRAVENOUS

## 2018-02-17 MED ORDER — SIMETHICONE 80 MG PO CHEW: 80.0000 mg | CHEWABLE_TABLET | ORAL | Status: DC | PRN

## 2018-02-17 MED ORDER — OXYCODONE HCL 5 MG PO TABS: 10.0000 mg | ORAL_TABLET | ORAL | Status: DC | PRN

## 2018-02-17 MED ORDER — LIDOCAINE HCL (PF) 1 % IJ SOLN
30.0000 mL | INTRAMUSCULAR | Status: DC | PRN
  Filled 2018-02-17: qty 30

## 2018-02-17 MED ORDER — DIBUCAINE 1 % RE OINT: 1.0000 "application " | TOPICAL_OINTMENT | RECTAL | Status: DC | PRN

## 2018-02-17 MED ORDER — COCONUT OIL OIL
1.0000 "application " | TOPICAL_OIL | Status: DC | PRN
  Administered 2018-02-17: 1 via TOPICAL
  Filled 2018-02-17: qty 120

## 2018-02-17 MED ORDER — SENNOSIDES-DOCUSATE SODIUM 8.6-50 MG PO TABS
2.0000 | ORAL_TABLET | ORAL | Status: DC
  Administered 2018-02-17: 2 via ORAL
  Filled 2018-02-17: qty 2

## 2018-02-17 MED ORDER — ZOLPIDEM TARTRATE 5 MG PO TABS: 5.0000 mg | ORAL_TABLET | Freq: Every evening | ORAL | Status: DC | PRN

## 2018-02-17 MED ORDER — IBUPROFEN 600 MG PO TABS
600.0000 mg | ORAL_TABLET | Freq: Four times a day (QID) | ORAL | Status: DC
  Administered 2018-02-17 – 2018-02-18 (×5): 600 mg via ORAL
  Filled 2018-02-17 (×5): qty 1

## 2018-02-17 MED ORDER — DIPHENHYDRAMINE HCL 25 MG PO CAPS: 25.0000 mg | ORAL_CAPSULE | Freq: Four times a day (QID) | ORAL | Status: DC | PRN

## 2018-02-17 MED ORDER — LACTATED RINGERS IV SOLN: 500.0000 mL | INTRAVENOUS | Status: DC | PRN

## 2018-02-17 MED ORDER — PRENATAL MULTIVITAMIN CH
1.0000 | ORAL_TABLET | Freq: Every day | ORAL | Status: DC
  Administered 2018-02-17: 1 via ORAL
  Filled 2018-02-17: qty 1

## 2018-02-17 MED ORDER — ONDANSETRON HCL 4 MG/2ML IJ SOLN: 4.0000 mg | Freq: Four times a day (QID) | INTRAMUSCULAR | Status: DC | PRN

## 2018-02-17 MED ORDER — SOD CITRATE-CITRIC ACID 500-334 MG/5ML PO SOLN: 30.0000 mL | ORAL | Status: DC | PRN

## 2018-02-17 MED ORDER — WITCH HAZEL-GLYCERIN EX PADS: 1.0000 "application " | MEDICATED_PAD | CUTANEOUS | Status: DC | PRN

## 2018-02-17 NOTE — Lactation Note (Signed)
This note was copied from a baby's chart. Lactation Consultation Note  Patient Name: Deborah Russell VWUJW'JToday's Date: 02/17/2018 Reason for consult: Initial assessment;Early term 5137-38.6wks  6716 hours old early term female who is being exclusively BF by her mother, she's a P2 and somehow experienced BF. She was able to BF her first child for 5 weeks but experienced some breastfeeding difficulties due to a poor latch, her nipples were scabbed and bleeding. She already knows how to hand express, LC reviewed hand expression with mom and colostrum was flowing of her breasts very easily. She has a Spectra 2 DEBP at home.  When LC was doing hand expression, noted that her right nipple had a tiny scab on the tip, asked her RN to get her some coconut oil. Reviewed treatment for sore nipples. Offered assistance with latch, baby was asleep when entering the room, mom agreed to wake her up to feed, LC took baby STS to mom's breast in football position and she was able to latch easily after doing some suck training. Audible swallows were noted when doing breast compression and also spontaneously. Baby still nursing when exiting the room.  Encouraged mom to feed baby STS 8-12 times/24 hours or sooner if feeding cues are present. Reviewed BF brochure, BF resources and feeding diary, mom is aware of LC services and will call PRN.  Maternal Data Formula Feeding for Exclusion: No Has patient been taught Hand Expression?: Yes Does the patient have breastfeeding experience prior to this delivery?: Yes  Feeding Feeding Type: Breast Fed Length of feed: 10 min(Baby still nursing when exiting the room)  LATCH Score Latch: Grasps breast easily, tongue down, lips flanged, rhythmical sucking.  Audible Swallowing: A few with stimulation  Type of Nipple: Everted at rest and after stimulation  Comfort (Breast/Nipple): Soft / non-tender(the other nipple had a tiny scab though)  Hold (Positioning): Assistance needed  to correctly position infant at breast and maintain latch.  LATCH Score: 8  Interventions Interventions: Breast feeding basics reviewed;Assisted with latch;Skin to skin;Breast massage;Hand express;Breast compression;Adjust position;Support pillows;Position options;Coconut oil  Lactation Tools Discussed/Used Tools: Coconut oil WIC Program: No   Consult Status Consult Status: Follow-up Date: 02/18/18 Follow-up type: In-patient    Deborah Russell [name] [name] 02/17/2018, 7:50 PM

## 2018-02-17 NOTE — Anesthesia Postprocedure Evaluation (Signed)
Anesthesia Post Note  Patient: Deborah Russell  Procedure(s) Performed: AN AD HOC LABOR EPIDURAL     Patient location during evaluation: Mother Baby Anesthesia Type: Epidural Level of consciousness: awake and alert Pain management: pain level controlled Vital Signs Assessment: post-procedure vital signs reviewed and stable Respiratory status: spontaneous breathing, nonlabored ventilation and respiratory function stable Cardiovascular status: stable Postop Assessment: no headache, no backache, epidural receding and no apparent nausea or vomiting Anesthetic complications: no    Last Vitals:  Vitals:   02/17/18 1404 02/17/18 1718  BP: 106/66 113/73  Pulse: 91 85  Resp: 16 18  Temp: 36.7 C   SpO2: 98%     Last Pain:  Vitals:   02/17/18 1404  TempSrc: Oral  PainSc:    Pain Goal:                 Rica RecordsICKELTON,Liborio Saccente

## 2018-02-17 NOTE — Progress Notes (Signed)
Post Partum Day 0 Subjective: no complaints, up ad lib, voiding, tolerating PO and nl lochia, pain controlled  Objective: Blood pressure 106/65, pulse 100, temperature 98.2 F (36.8 C), temperature source Oral, resp. rate 17, height 5\' 5"  (1.651 m), weight 83.9 kg (185 lb), last menstrual period 05/22/2017, SpO2 98 %, unknown if currently breastfeeding.  Physical Exam:  General: alert and no distress Lochia: appropriate Uterine Fundus: firm  Recent Labs    02/16/18 1522  HGB 11.8*  HCT 37.1    Assessment/Plan: Breastfeeding and Lactation consult.  Routine PP care.     LOS: 1 day   Deborah Russell 02/17/2018, 7:14 AM

## 2018-02-18 MED ORDER — IBUPROFEN 600 MG PO TABS: 600.0000 mg | ORAL_TABLET | Freq: Four times a day (QID) | ORAL | 1 refills | Status: AC | PRN

## 2018-02-18 MED ORDER — PRENATAL MULTIVITAMIN CH: 1.0000 | ORAL_TABLET | Freq: Every day | ORAL | 3 refills | Status: AC

## 2018-02-18 NOTE — Discharge Summary (Signed)
OB Discharge Summary     Patient Name: Deborah Russell DOB: Apr 27, 1991 MRN: [id number]  Date of admission: 02/16/2018 Delivering MD: Sherian Rein   Date of discharge: 02/18/2018  Admitting diagnosis: 39.2 WKS, LEAKING FLUIDS Intrauterine pregnancy: [redacted]w[redacted]d     Secondary diagnosis:  Principal Problem:   SVD (spontaneous vaginal delivery) Active Problems:   ROM (rupture of membranes), premature   Pregnancy  Additional problems: N/A     Discharge diagnosis: Term Pregnancy Delivered                                                                                                Post partum procedures:N/A  Augmentation: Pitocin  Complications: None  Hospital course:  Onset of Labor With Vaginal Delivery     27 y.o. yo [id number] at [redacted]w[redacted]d was admitted in Latent Labor on 02/16/2018. Patient had an uncomplicated labor course as follows:  Membrane Rupture Time/Date: 11:30 AM ,02/16/2018   Intrapartum Procedures: Episiotomy: None [1]                                         Lacerations:  None [1]  Patient had a delivery of a Viable infant. 02/17/2018  Information for the patient's newborn:  Shenaya, Lebo [[id number]]  Delivery Method: Vag-Spont    Pateint had an uncomplicated postpartum course.  She is ambulating, tolerating a regular diet, passing flatus, and urinating well. Patient is discharged home in stable condition on 02/18/18.   Physical exam  Vitals:   02/17/18 1404 02/17/18 1718 02/17/18 2240 02/18/18 0611  BP: 106/66 113/73 121/74 112/71  Pulse: 91 85 92 75  Resp: 16 18 16 18   Temp: 98 F (36.7 C)  98.7 F (37.1 C) 97.8 F (36.6 C)  TempSrc: Oral  Oral Oral  SpO2: 98%  98%   Weight:      Height:       General: alert and no distress Lochia: appropriate Uterine Fundus: firm  Labs: Lab Results  Component Value Date   WBC 12.4 (H) 02/17/2018   HGB 9.1 (L) 02/17/2018   HCT 28.1 (L) 02/17/2018   MCV 75.3 (L) 02/17/2018   PLT 136 (L) 02/17/2018    CMP Latest Ref Rng & Units 03/20/2013  Glucose 65 - 99 mg/dL 401(U)  BUN 7 - 18 mg/dL 16  Creatinine 2.72 - 5.36 mg/dL 6.44  Sodium 034 - 742 mmol/L 139  Potassium 3.5 - 5.1 mmol/L 4.0  Chloride 98 - 107 mmol/L 107  CO2 21 - 32 mmol/L 28  Calcium 8.5 - 10.1 mg/dL 9.3  Total Protein 6.4 - 8.2 g/dL 7.9  Total Bilirubin 0.2 - 1.0 mg/dL 0.6  Alkaline Phos 50 - 136 Unit/L 58  AST 15 - 37 Unit/L 24  ALT 12 - 78 U/L 21    Discharge instruction: per After Visit Summary and "Baby and Me Booklet".  After visit meds:  Allergies as of 02/18/2018   No Known Allergies     Medication List  TAKE these medications   ibuprofen 600 MG tablet Commonly known as:  ADVIL,MOTRIN Take 1 tablet (600 mg total) by mouth every 6 (six) hours as needed.   prenatal multivitamin Tabs tablet Take 1 tablet by mouth daily at 12 noon.       Diet: routine diet  Activity: Advance as tolerated. Pelvic rest for 6 weeks.   Outpatient follow up:6 weeks Follow up Appt:No future appointments. Follow up Visit:No follow-ups on file.  Postpartum contraception: Undecided  Newborn Data: Live born female  Birth Weight: 8 lb 13.3 oz (4006 g) APGAR: 9, 9  Newborn Delivery   Birth date/time:  02/17/2018 02:56:00 Delivery type:  Vaginal, Spontaneous     Baby Feeding: Breast Disposition:home with mother   02/18/2018 Sherian ReinJody Bovard-Stuckert, MD

## 2018-02-18 NOTE — Progress Notes (Addendum)
Post Partum Day 1 Subjective: no complaints, up ad lib, voiding, tolerating PO and nl lochia, pain controlled  Objective: Blood pressure 112/71, pulse 75, temperature 97.8 F (36.6 C), temperature source Oral, resp. rate 18, height 5\' 5"  (1.651 m), weight 83.9 kg (185 lb), last menstrual period 05/22/2017, SpO2 98 %, unknown if currently breastfeeding.  Physical Exam:  General: alert and no distress Lochia: appropriate Uterine Fundus: firm   Recent Labs    02/16/18 1522 02/17/18 0624  HGB 11.8* 9.1*  HCT 37.1 28.1*    Assessment/Plan: Discharge home, Breastfeeding and Lactation consult.  Routine PP care.  Pt desires d/c to home - d/c with motrin and PNV.  F/u 6 weeks   LOS: 2 days   Deborah Russell 02/18/2018, 6:59 AM

## 2018-02-18 NOTE — Lactation Note (Signed)
This note was copied from a baby's chart. Lactation Consultation Note  Patient Name: Deborah Russell FractionDanielle Smiddy WGNFA'OToday's Date: 02/18/2018 Reason for consult: Follow-up assessment;Early term 5237-38.6wks  Visited with P2 Mom of ET baby at 6432 hrs old age.  Baby at 6% weight loss.  Mom concerned that this baby tucks her lower lip in when she latches, and at times backs off the breast and only latches onto nipple.  Reviewed positioning.  Baby cueing and offered to assist/assess baby's latch.  Mom taught how to best support her breast, and sandwich it to accommodate a deeper latch to breast.  Reviewed breast massage and hand expression, colostrum easily expressed.   Baby opened wide, latched and then pulled back.  (did observe a posterior frenulum, tongue can lift up and extend)  Re-latched with more sandwiching of breast, and baby sustained a deep latch.  Mom denies any discomfort.  Regular swallowing identified for Mom.  Showed FOB how to check lower lip.  Baby's lower lip flanged, but demonstrated how to uncurl if tucked. Mom assisted to do alternate breast compression to increase milk transfer.   Reviewed importance of STS, and feeding often on cue. Mom aware of OP Lactation services available to her.  Encouraged to call prn for assistance.     Feeding Feeding Type: Breast Fed  LATCH Score Latch: Grasps breast easily, tongue down, lips flanged, rhythmical sucking.  Audible Swallowing: Spontaneous and intermittent  Type of Nipple: Everted at rest and after stimulation  Comfort (Breast/Nipple): Soft / non-tender  Hold (Positioning): Assistance needed to correctly position infant at breast and maintain latch.  LATCH Score: 9  Interventions Interventions: Breast feeding basics reviewed;Assisted with latch;Skin to skin;Breast massage;Hand express;Breast compression;Adjust position;Support pillows;Position options;Expressed milk   Consult Status Consult Status: Complete Date:  02/18/18 Follow-up type: Call as needed    Judee ClaraSmith, Shiquan Mathieu E 02/18/2018, 11:08 AM

## 2018-02-21 ENCOUNTER — Ambulatory Visit: Payer: Self-pay

## 2018-02-21 NOTE — Lactation Note (Signed)
This note was copied from a baby's chart. 02/21/2018  Name: Deborah Russell MRN: [id number] Date of Birth: 02/17/2019 Gestational Age: Gestational Age: [redacted]w[redacted]d Birth Weight: 141.3 oz Weight today:   8 pounds 5.8 ounces (3794 grams) with clean newborn diaper.    Deborah Russell presents today with mom and dad for feeding assessment. Mom reports she was concerned that infant did not latch as well after her milk came in but is doing better today.   Deborah Russell has gained 19 grams in the last 3 days with an average daily weight gain of 6 grams a day. Mom's milk came in yesterday morning. Infant is voiding and stooling well with yellow seedy stools present. Infant with 2 voids and a very large yellow stool in the office.   Mom wakes infant up to feed for most feedings. Infant feeds on one breast per feeding at this time.   Mom is very full and finds it difficult to latch infant at times. Reviewed pre pumping as needed and comfort pumping post feeding.   Infant fed on the left breast for < 10 minutes and took 48 ml. Breast did soften with feeding. Infant did not wish to relatch after feeding. Mom denied pain with feeding, nipple was rounded post feeding.   Mom reports this is a typical feeding for infant. Discussed infant is able to get a good volume in a short amount of time and as milk supply regulates over the next week, mom may see infant want to feed for longer times and possibly on both breasts.   LC feels mom and infant are doing well.   Infant to follow up with Pediatrician on July 29. Infant to follow up with lactation as needed. Mom aware of BF Support at [facility].   Mom reports all questions/concerns have been answered. She reports she feels better after talking with LC. Mom to call with questions/concerns as needed.     General Information: Mother's reason for visit: difficulty with latching yesterday Consult: Initial Lactation consultant: Noralee Stain RN,IBCLC Breastfeeding experience:  going ok until milk came in and became nore difficult to latch, better today   Maternal medications: Pre-natal vitamin  Breastfeeding History: Frequency of breast feeding: every 2 hours on demand Duration of feeding: 5-25 minutes  Supplementation:                 Pump type: Spectra Pump frequency: x 1 Pump volume: 30 ml  Infant Output Assessment: Voids per 24 hours: 7-8 Urine color: Clear yellow Stools per 24 hours: 8+ Stool color: Yellow  Breast Assessment: Breast: Full, Compressible Nipple: Erect Pain level: 0 Pain interventions: Bra  Feeding Assessment: Infant oral assessment: WNL   Positioning: Cross cradle(left breast) Latch: 2 - Grasps breast easily, tongue down, lips flanged, rhythmical sucking. Audible swallowing: 2 - Spontaneous and intermittent Type of nipple: 2 - Everted at rest and after stimulation Comfort: 2 - Soft/non-tender Hold: 2 - No assistance needed to correctly position infant at breast LATCH score: 10 Latch assessment: Deep Lips flanged: Yes Suck assessment: Nutritive   Pre-feed weight: 3794 grams Post feed weight: 3842 grams Amount transferred: 48 ml Amount supplemented: 0  Additional Feeding Assessment:                                    Totals: Total amount transferred: 48 ml Total supplement given: 0 Total amount pumped post feed: 30 ml   Plan:  1. Offer infant the breast with feeding cues with goal of 8-12 feeds in 24 hours.  2. Feed infant for as long as she wants 3. Empty one breast before offering second breast 4. Pre pump breast before latching if nipple/ areola is full and firm prior to latch 5. Post pump after breast feeding for comfort as needed 6. Deborah Russell needs about 71-95 ml (2.5-3 ounces) for 8 feedings a day or 570-760 ml (19-25 ounces) a day. She may need more or less with feedings depending on how frequently she nurses.  7. Keep up with [employer] 8. Call with any questions/concerns as needed  [phone] 9. Thank you for allowing me to assist you today 10. Follow up with Lactation as needed  Ed BlalockSharon S Hice RN, IBCLC                                                     Silas FloodSharon S Hice 02/21/2018, 11:42 AM

## 2018-02-22 ENCOUNTER — Inpatient Hospital Stay (HOSPITAL_COMMUNITY): Admission: RE | Admit: 2018-02-22 | Payer: Medicaid Other | Source: Ambulatory Visit

## 2018-03-30 DIAGNOSIS — K649 Unspecified hemorrhoids: Secondary | ICD-10-CM | POA: Diagnosis not present

## 2018-03-30 DIAGNOSIS — Z124 Encounter for screening for malignant neoplasm of cervix: Secondary | ICD-10-CM | POA: Diagnosis not present

## 2018-03-30 DIAGNOSIS — Z3009 Encounter for other general counseling and advice on contraception: Secondary | ICD-10-CM | POA: Diagnosis not present

## 2018-03-30 DIAGNOSIS — Z1389 Encounter for screening for other disorder: Secondary | ICD-10-CM | POA: Diagnosis not present

## 2018-08-30 IMAGING — US US OB COMP LESS 14 WK
1 series · 15 of 28 positions shown · non-contrast
Comparison: None.

CLINICAL DATA: Right lower quadrant pain for 4 days. Beta HCG
pending.

EXAM:
OBSTETRIC <14 WK US AND TRANSVAGINAL OB US
TECHNIQUE: Both transabdominal and transvaginal ultrasound examinations were
performed for complete evaluation of the gestation as well as the
maternal uterus, adnexal regions, and pelvic cul-de-sac.
Transvaginal technique was performed to assess early pregnancy.

[Series 1: us ob comp less 14 wk · 61 acquisitions, 15 frames shown]
[im 1/61]
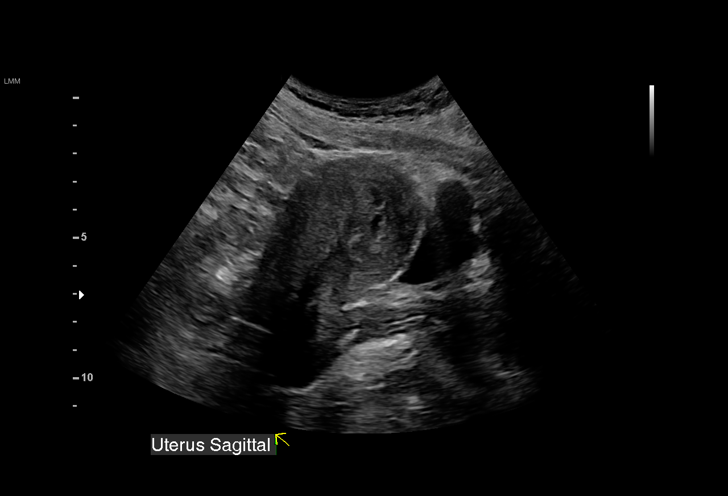
[im 5/61]
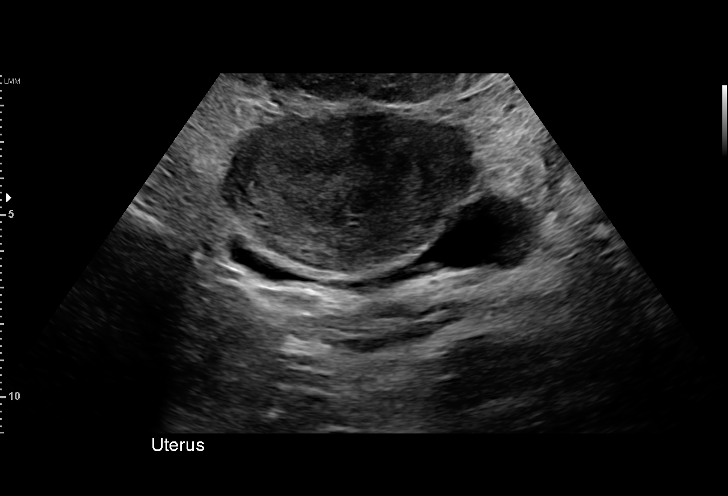
[im 9/61]
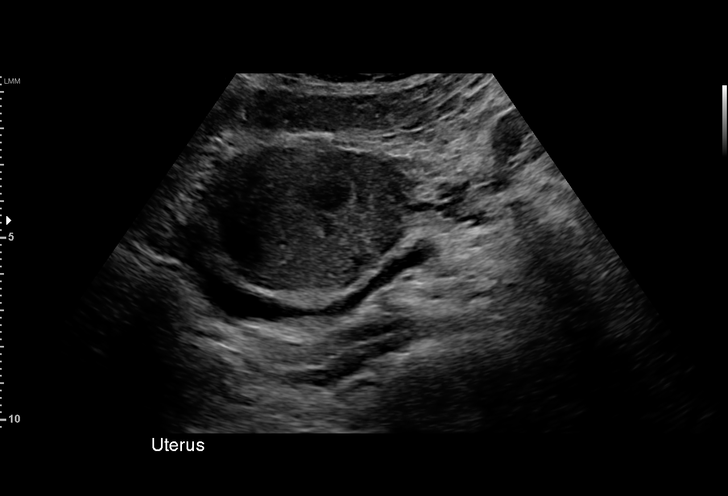
[im 14/61]
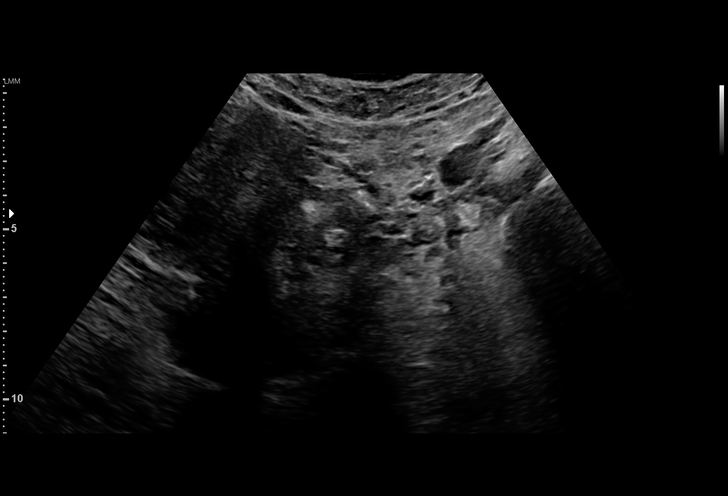
[im 18/61]
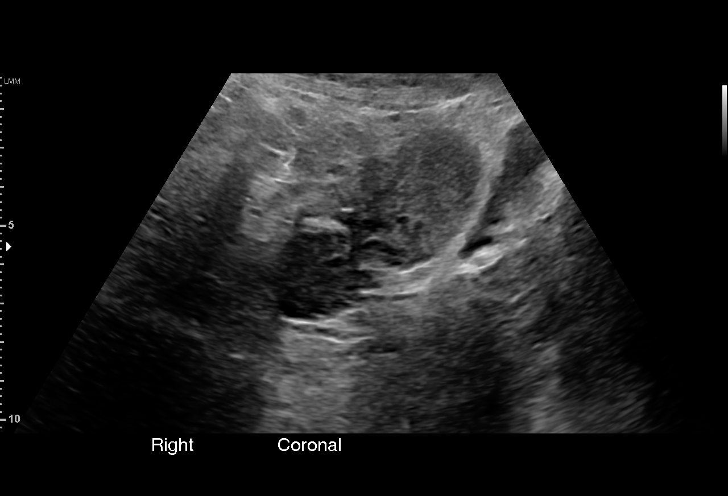
[im 23/61]
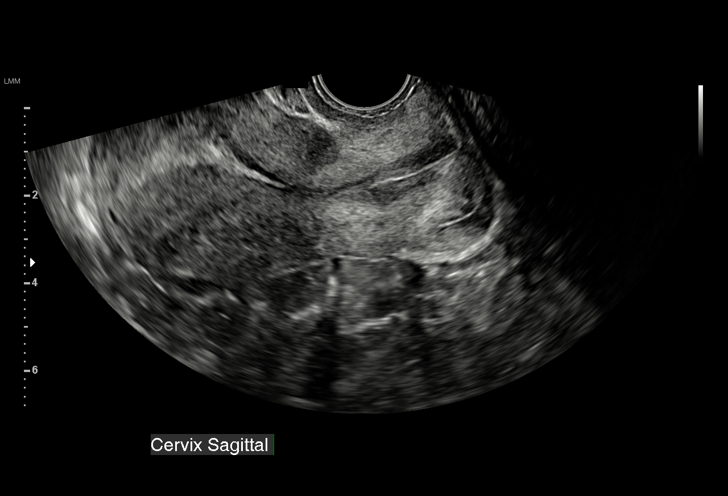
[im 27/61]
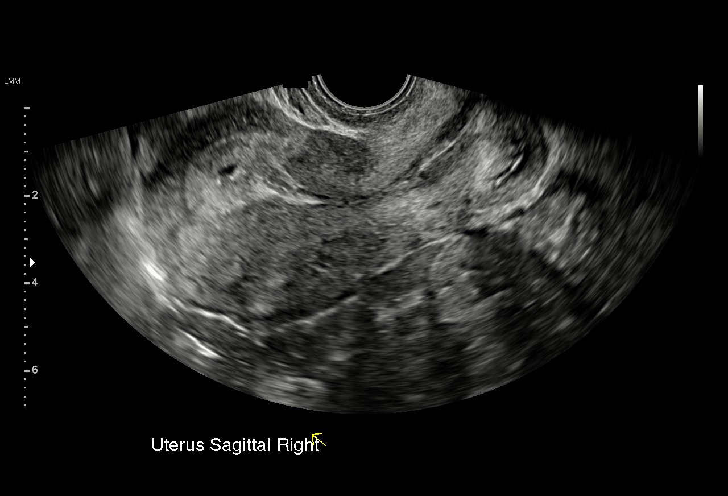
[im 32/61]
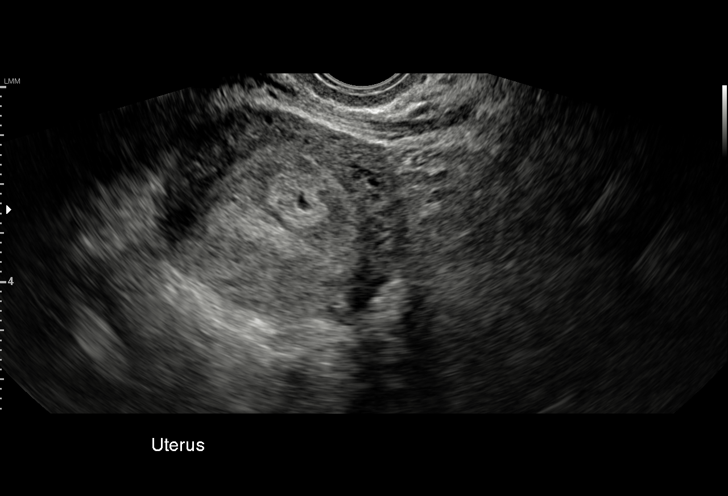
[im 34/61]
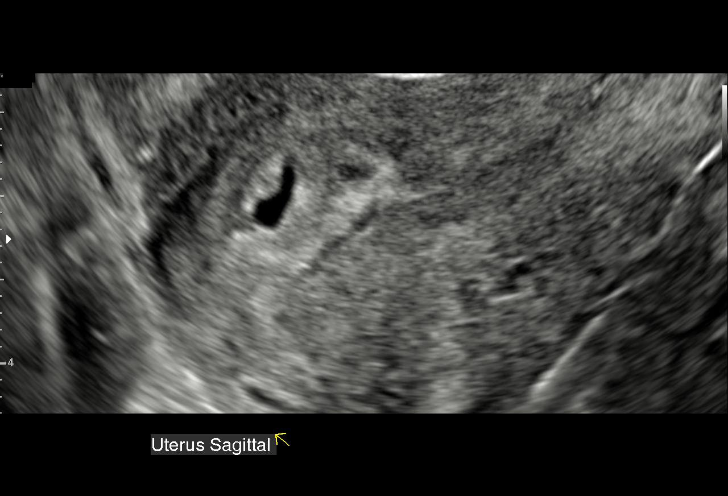
[im 38/61]
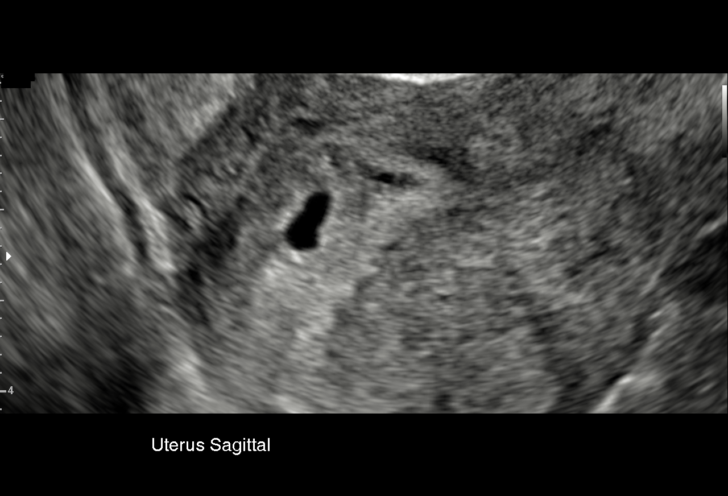
[im 43/61]
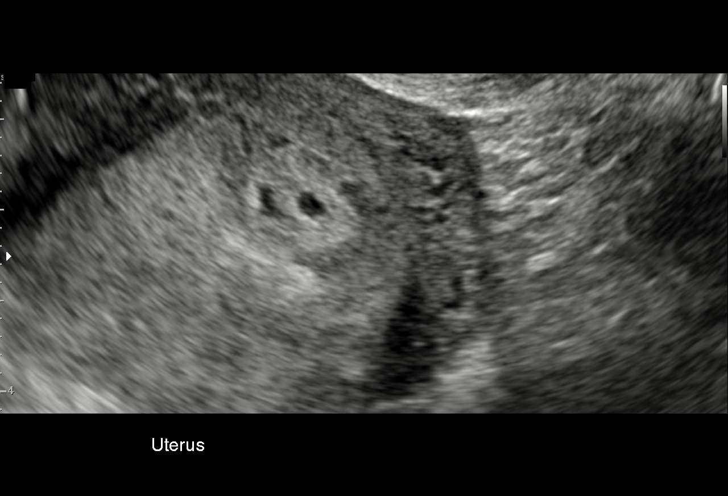
[im 47/61]
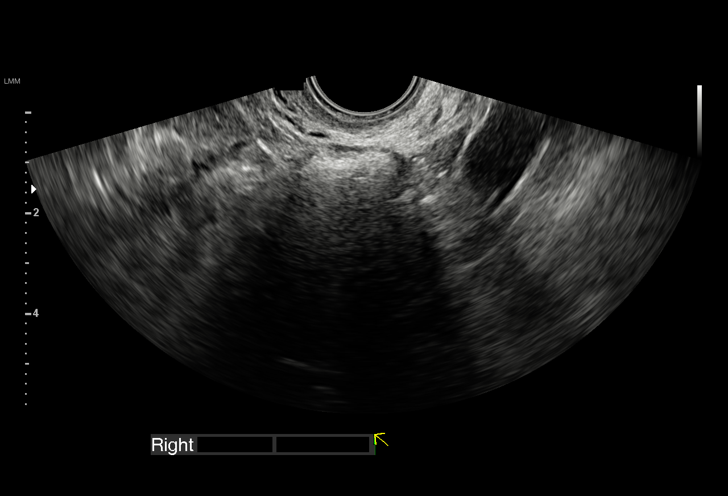
[im 52/61]
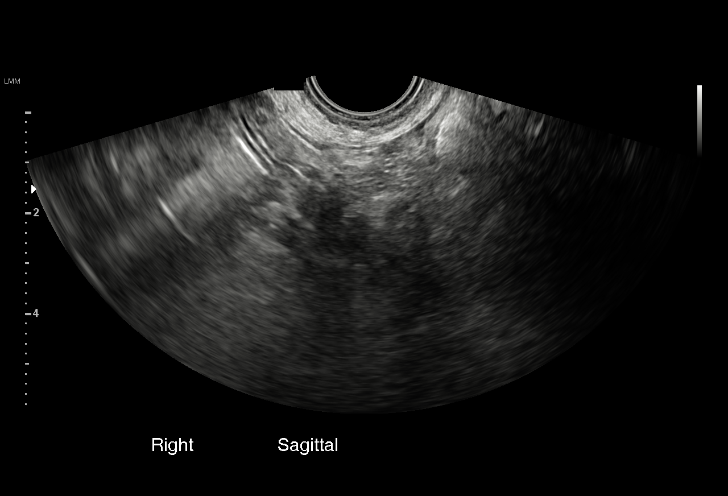
[im 56/61]
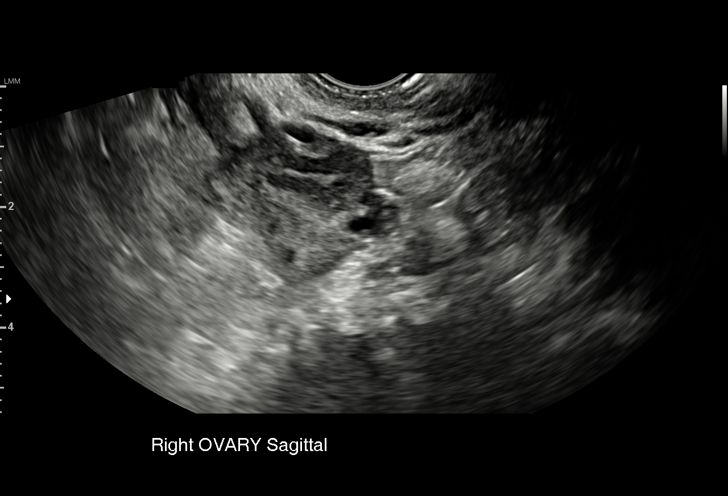
[im 61/61]
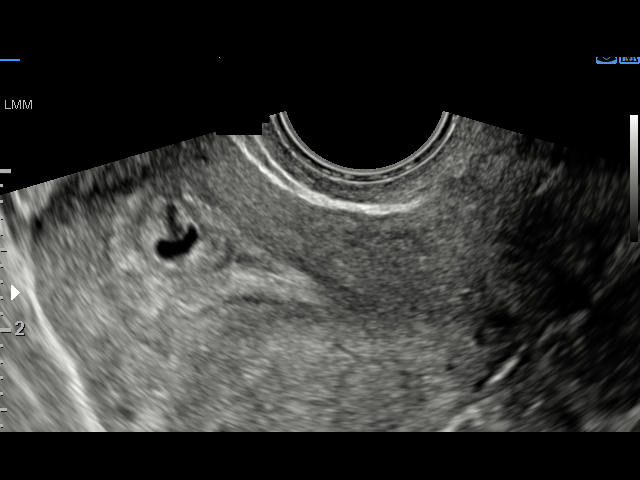

[15 of 28 positions shown; findings below may reference images not displayed]

FINDINGS: Intrauterine gestational sac: Small irregular sac-like collection
within the fundal region of the uterus, probable gestational sac.

Yolk sac:  Not seen.

Embryo:  Not seen.

Cardiac Activity: Not seen

Heart Rate:   bpm

MSD: 5.6 mm  mm   5 w   2  d

CRL:    mm    w    d                  US EDC:

Subchorionic hemorrhage:  None visualized.

Maternal uterus/adnexae: Maternal right ovary appears normal and
there is no mass or free fluid seen within the right adnexal region.
Left ovary is not seen. Left adnexa is difficult to visualize due to
obscuring stool and bowel gas.
IMPRESSION: 1. Small irregular sac-like collection within the fundal region of
the uterus, probable early gestational sac. The irregular
configuration is suspicious for abnormal pregnancy. No yolk sac or
fetal pole identified to confirm gestational sac. Recommend
follow-up with serial beta HCG levels and follow-up obstetric
ultrasound as needed to ensure normal pregnancy.
2. Right ovary appears normal and there is no mass or free fluid
identified in the right adnexal region.
3. Left ovary is not seen. No obvious mass or free fluid in the left
adnexa, however, characterization is limited by overlying stool and
bowel gas.

## 2020-02-27 DIAGNOSIS — Z20822 Contact with and (suspected) exposure to covid-19: Secondary | ICD-10-CM | POA: Diagnosis not present

## 2020-04-27 DIAGNOSIS — N1330 Unspecified hydronephrosis: Secondary | ICD-10-CM | POA: Diagnosis not present

## 2020-04-27 DIAGNOSIS — R109 Unspecified abdominal pain: Secondary | ICD-10-CM | POA: Diagnosis not present

## 2020-04-27 DIAGNOSIS — R300 Dysuria: Secondary | ICD-10-CM | POA: Diagnosis not present

## 2020-04-27 DIAGNOSIS — N10 Acute tubulo-interstitial nephritis: Secondary | ICD-10-CM | POA: Diagnosis not present

## 2020-08-15 DIAGNOSIS — J069 Acute upper respiratory infection, unspecified: Secondary | ICD-10-CM | POA: Diagnosis not present

## 2020-11-01 DIAGNOSIS — R350 Frequency of micturition: Secondary | ICD-10-CM | POA: Diagnosis not present

## 2020-11-01 DIAGNOSIS — Z79899 Other long term (current) drug therapy: Secondary | ICD-10-CM | POA: Diagnosis not present

## 2020-11-01 DIAGNOSIS — N390 Urinary tract infection, site not specified: Secondary | ICD-10-CM | POA: Diagnosis not present
